# Patient Record
Sex: Female | Born: 1980 | Race: Black or African American | Hispanic: No | Marital: Single | Smoking: Current every day smoker
Health system: Southern US, Community
[De-identification: ages and names within clinical notes are randomized; demographics above are authoritative.]

## PROBLEM LIST (undated history)

## (undated) ENCOUNTER — Emergency Department (HOSPITAL_COMMUNITY): Discharge status: Against Medical Advice | Payer: Self-pay

## (undated) DIAGNOSIS — N390 Urinary tract infection, site not specified: Secondary | ICD-10-CM

## (undated) DIAGNOSIS — I10 Essential (primary) hypertension: Secondary | ICD-10-CM

## (undated) HISTORY — PX: TUBAL LIGATION: SHX77

## (undated) HISTORY — PX: CHOLECYSTECTOMY: SHX55

---

## 2006-05-06 ENCOUNTER — Inpatient Hospital Stay (HOSPITAL_COMMUNITY): Admission: AD | Admit: 2006-05-06 | Discharge: 2006-05-06 | Payer: Self-pay | Admitting: Obstetrics and Gynecology

## 2006-10-04 ENCOUNTER — Inpatient Hospital Stay (HOSPITAL_COMMUNITY): Admission: AD | Admit: 2006-10-04 | Discharge: 2006-10-04 | Payer: Self-pay | Admitting: Gynecology

## 2006-11-13 ENCOUNTER — Inpatient Hospital Stay (HOSPITAL_COMMUNITY): Admission: AD | Admit: 2006-11-13 | Discharge: 2006-11-13 | Payer: Self-pay | Admitting: Obstetrics & Gynecology

## 2006-12-11 ENCOUNTER — Inpatient Hospital Stay (HOSPITAL_COMMUNITY): Admission: AD | Admit: 2006-12-11 | Discharge: 2006-12-11 | Payer: Self-pay | Admitting: Obstetrics

## 2006-12-22 ENCOUNTER — Ambulatory Visit (HOSPITAL_COMMUNITY): Admission: RE | Admit: 2006-12-22 | Discharge: 2006-12-22 | Payer: Self-pay | Admitting: Obstetrics & Gynecology

## 2007-01-05 ENCOUNTER — Inpatient Hospital Stay (HOSPITAL_COMMUNITY): Admission: AD | Admit: 2007-01-05 | Discharge: 2007-01-05 | Payer: Self-pay | Admitting: Obstetrics & Gynecology

## 2007-01-08 ENCOUNTER — Inpatient Hospital Stay (HOSPITAL_COMMUNITY): Admission: AD | Admit: 2007-01-08 | Discharge: 2007-01-08 | Payer: Self-pay | Admitting: Obstetrics & Gynecology

## 2007-02-05 ENCOUNTER — Ambulatory Visit (HOSPITAL_COMMUNITY): Admission: RE | Admit: 2007-02-05 | Discharge: 2007-02-05 | Payer: Self-pay | Admitting: Obstetrics & Gynecology

## 2007-02-19 ENCOUNTER — Inpatient Hospital Stay (HOSPITAL_COMMUNITY): Admission: AD | Admit: 2007-02-19 | Discharge: 2007-02-19 | Payer: Self-pay | Admitting: Obstetrics & Gynecology

## 2007-03-17 ENCOUNTER — Inpatient Hospital Stay (HOSPITAL_COMMUNITY): Admission: AD | Admit: 2007-03-17 | Discharge: 2007-03-18 | Payer: Self-pay | Admitting: Obstetrics

## 2007-03-24 ENCOUNTER — Encounter: Admission: RE | Admit: 2007-03-24 | Discharge: 2007-04-20 | Payer: Self-pay | Admitting: Obstetrics & Gynecology

## 2007-04-09 ENCOUNTER — Inpatient Hospital Stay (HOSPITAL_COMMUNITY): Admission: AD | Admit: 2007-04-09 | Discharge: 2007-04-09 | Payer: Self-pay | Admitting: Obstetrics

## 2007-04-09 ENCOUNTER — Ambulatory Visit: Payer: Self-pay | Admitting: Vascular Surgery

## 2007-04-09 ENCOUNTER — Encounter: Payer: Self-pay | Admitting: Obstetrics

## 2007-04-14 ENCOUNTER — Inpatient Hospital Stay (HOSPITAL_COMMUNITY): Admission: AD | Admit: 2007-04-14 | Discharge: 2007-04-14 | Payer: Self-pay | Admitting: Obstetrics & Gynecology

## 2007-04-16 ENCOUNTER — Inpatient Hospital Stay (HOSPITAL_COMMUNITY): Admission: AD | Admit: 2007-04-16 | Discharge: 2007-04-16 | Payer: Self-pay | Admitting: Obstetrics

## 2007-04-18 ENCOUNTER — Inpatient Hospital Stay (HOSPITAL_COMMUNITY): Admission: AD | Admit: 2007-04-18 | Discharge: 2007-04-19 | Payer: Self-pay | Admitting: Obstetrics & Gynecology

## 2007-05-11 ENCOUNTER — Inpatient Hospital Stay (HOSPITAL_COMMUNITY): Admission: AD | Admit: 2007-05-11 | Discharge: 2007-05-14 | Payer: Self-pay | Admitting: Obstetrics & Gynecology

## 2007-12-02 ENCOUNTER — Emergency Department (HOSPITAL_COMMUNITY): Admission: EM | Admit: 2007-12-02 | Discharge: 2007-12-02 | Payer: Self-pay | Admitting: Emergency Medicine

## 2007-12-27 ENCOUNTER — Emergency Department (HOSPITAL_COMMUNITY): Admission: EM | Admit: 2007-12-27 | Discharge: 2007-12-27 | Payer: Self-pay | Admitting: Emergency Medicine

## 2008-01-20 ENCOUNTER — Emergency Department (HOSPITAL_COMMUNITY): Admission: EM | Admit: 2008-01-20 | Discharge: 2008-01-20 | Payer: Self-pay | Admitting: Emergency Medicine

## 2008-03-04 ENCOUNTER — Emergency Department (HOSPITAL_COMMUNITY): Admission: EM | Admit: 2008-03-04 | Discharge: 2008-03-04 | Payer: Self-pay | Admitting: Emergency Medicine

## 2008-03-06 ENCOUNTER — Inpatient Hospital Stay (HOSPITAL_COMMUNITY): Admission: EM | Admit: 2008-03-06 | Discharge: 2008-03-07 | Payer: Self-pay | Admitting: Emergency Medicine

## 2008-03-06 ENCOUNTER — Ambulatory Visit: Payer: Self-pay | Admitting: Family Medicine

## 2008-05-31 ENCOUNTER — Emergency Department (HOSPITAL_COMMUNITY): Admission: EM | Admit: 2008-05-31 | Discharge: 2008-06-01 | Payer: Self-pay | Admitting: Emergency Medicine

## 2008-09-13 ENCOUNTER — Emergency Department (HOSPITAL_COMMUNITY): Admission: EM | Admit: 2008-09-13 | Discharge: 2008-09-13 | Payer: Self-pay | Admitting: Emergency Medicine

## 2009-03-25 ENCOUNTER — Inpatient Hospital Stay (HOSPITAL_COMMUNITY): Admission: AD | Admit: 2009-03-25 | Discharge: 2009-03-26 | Payer: Self-pay | Admitting: Obstetrics & Gynecology

## 2009-04-28 ENCOUNTER — Ambulatory Visit (HOSPITAL_COMMUNITY): Admission: RE | Admit: 2009-04-28 | Discharge: 2009-04-28 | Payer: Self-pay | Admitting: Obstetrics

## 2009-07-20 ENCOUNTER — Encounter: Admission: RE | Admit: 2009-07-20 | Discharge: 2009-08-24 | Payer: Self-pay | Admitting: Obstetrics & Gynecology

## 2009-07-27 ENCOUNTER — Inpatient Hospital Stay (HOSPITAL_COMMUNITY): Admission: AD | Admit: 2009-07-27 | Discharge: 2009-07-29 | Payer: Self-pay | Admitting: Obstetrics

## 2009-08-20 ENCOUNTER — Emergency Department (HOSPITAL_COMMUNITY): Admission: EM | Admit: 2009-08-20 | Discharge: 2009-08-20 | Payer: Self-pay | Admitting: Emergency Medicine

## 2009-08-29 ENCOUNTER — Inpatient Hospital Stay (HOSPITAL_COMMUNITY): Admission: AD | Admit: 2009-08-29 | Discharge: 2009-08-29 | Payer: Self-pay | Admitting: Obstetrics & Gynecology

## 2009-09-08 ENCOUNTER — Observation Stay (HOSPITAL_COMMUNITY): Admission: AD | Admit: 2009-09-08 | Discharge: 2009-09-08 | Payer: Self-pay | Admitting: Obstetrics & Gynecology

## 2009-09-17 ENCOUNTER — Inpatient Hospital Stay (HOSPITAL_COMMUNITY): Admission: AD | Admit: 2009-09-17 | Discharge: 2009-09-18 | Payer: Self-pay | Admitting: Obstetrics

## 2009-09-25 ENCOUNTER — Inpatient Hospital Stay (HOSPITAL_COMMUNITY): Admission: AD | Admit: 2009-09-25 | Discharge: 2009-09-28 | Payer: Self-pay | Admitting: Obstetrics & Gynecology

## 2009-12-26 ENCOUNTER — Emergency Department (HOSPITAL_COMMUNITY): Admission: EM | Admit: 2009-12-26 | Discharge: 2009-12-26 | Payer: Self-pay | Admitting: Family Medicine

## 2010-02-16 ENCOUNTER — Ambulatory Visit (HOSPITAL_COMMUNITY): Admission: RE | Admit: 2010-02-16 | Discharge: 2010-02-16 | Payer: Self-pay | Admitting: Obstetrics & Gynecology

## 2010-02-23 ENCOUNTER — Encounter: Payer: Self-pay | Admitting: Gastroenterology

## 2010-03-13 ENCOUNTER — Encounter: Payer: Self-pay | Admitting: Gastroenterology

## 2010-04-09 ENCOUNTER — Encounter: Payer: Self-pay | Admitting: Gastroenterology

## 2010-04-18 ENCOUNTER — Emergency Department (HOSPITAL_COMMUNITY): Admission: EM | Admit: 2010-04-18 | Discharge: 2010-04-18 | Payer: Self-pay | Admitting: Family Medicine

## 2010-04-22 ENCOUNTER — Emergency Department (HOSPITAL_COMMUNITY): Admission: EM | Admit: 2010-04-22 | Discharge: 2010-04-22 | Payer: Self-pay | Admitting: Family Medicine

## 2010-05-14 ENCOUNTER — Emergency Department (HOSPITAL_COMMUNITY): Admission: EM | Admit: 2010-05-14 | Discharge: 2010-05-14 | Payer: Self-pay | Admitting: Emergency Medicine

## 2010-05-22 ENCOUNTER — Ambulatory Visit (HOSPITAL_COMMUNITY): Admission: RE | Admit: 2010-05-22 | Discharge: 2010-05-22 | Payer: Self-pay | Admitting: Obstetrics & Gynecology

## 2010-06-04 ENCOUNTER — Emergency Department (HOSPITAL_COMMUNITY): Admission: EM | Admit: 2010-06-04 | Discharge: 2010-06-04 | Payer: Self-pay | Admitting: Emergency Medicine

## 2010-06-11 ENCOUNTER — Encounter (INDEPENDENT_AMBULATORY_CARE_PROVIDER_SITE_OTHER): Payer: Self-pay | Admitting: *Deleted

## 2010-06-11 ENCOUNTER — Emergency Department (HOSPITAL_COMMUNITY): Admission: EM | Admit: 2010-06-11 | Discharge: 2010-06-11 | Payer: Self-pay | Admitting: Emergency Medicine

## 2010-06-19 ENCOUNTER — Emergency Department (HOSPITAL_COMMUNITY): Admission: EM | Admit: 2010-06-19 | Discharge: 2010-06-19 | Payer: Self-pay | Admitting: Emergency Medicine

## 2010-06-29 ENCOUNTER — Ambulatory Visit (HOSPITAL_COMMUNITY): Admission: RE | Admit: 2010-06-29 | Discharge: 2010-06-30 | Payer: Self-pay | Admitting: Surgery

## 2010-06-29 ENCOUNTER — Encounter (INDEPENDENT_AMBULATORY_CARE_PROVIDER_SITE_OTHER): Payer: Self-pay | Admitting: Surgery

## 2010-06-30 ENCOUNTER — Encounter (INDEPENDENT_AMBULATORY_CARE_PROVIDER_SITE_OTHER): Payer: Self-pay | Admitting: *Deleted

## 2010-07-11 ENCOUNTER — Emergency Department (HOSPITAL_COMMUNITY): Admission: EM | Admit: 2010-07-11 | Discharge: 2010-07-11 | Payer: Self-pay | Admitting: Emergency Medicine

## 2010-07-11 ENCOUNTER — Encounter (INDEPENDENT_AMBULATORY_CARE_PROVIDER_SITE_OTHER): Payer: Self-pay | Admitting: *Deleted

## 2010-07-18 ENCOUNTER — Encounter: Payer: Self-pay | Admitting: Gastroenterology

## 2010-10-29 ENCOUNTER — Emergency Department (HOSPITAL_COMMUNITY)
Admission: EM | Admit: 2010-10-29 | Discharge: 2010-10-30 | Payer: Self-pay | Source: Home / Self Care | Admitting: Emergency Medicine

## 2010-10-30 ENCOUNTER — Encounter (INDEPENDENT_AMBULATORY_CARE_PROVIDER_SITE_OTHER): Payer: Self-pay | Admitting: *Deleted

## 2010-11-05 LAB — DIFFERENTIAL
Basophils Absolute: 0 10*3/uL (ref 0.0–0.1)
Basophils Relative: 0 % (ref 0–1)
Eosinophils Absolute: 0.1 10*3/uL (ref 0.0–0.7)
Eosinophils Relative: 1 % (ref 0–5)
Lymphocytes Relative: 39 % (ref 12–46)
Lymphs Abs: 2.7 10*3/uL (ref 0.7–4.0)
Monocytes Absolute: 0.6 10*3/uL (ref 0.1–1.0)
Monocytes Relative: 9 % (ref 3–12)
Neutro Abs: 3.5 10*3/uL (ref 1.7–7.7)
Neutrophils Relative %: 51 % (ref 43–77)

## 2010-11-05 LAB — URINALYSIS, ROUTINE W REFLEX MICROSCOPIC
Bilirubin Urine: NEGATIVE
Hgb urine dipstick: NEGATIVE
Ketones, ur: NEGATIVE mg/dL
Nitrite: NEGATIVE
Protein, ur: NEGATIVE mg/dL
Specific Gravity, Urine: 1.029 (ref 1.005–1.030)
Urine Glucose, Fasting: NEGATIVE mg/dL
Urobilinogen, UA: 1 mg/dL (ref 0.0–1.0)
pH: 8 (ref 5.0–8.0)

## 2010-11-05 LAB — BASIC METABOLIC PANEL
BUN: 17 mg/dL (ref 6–23)
CO2: 27 mEq/L (ref 19–32)
Calcium: 9.3 mg/dL (ref 8.4–10.5)
Chloride: 103 mEq/L (ref 96–112)
Creatinine, Ser: 0.61 mg/dL (ref 0.4–1.2)
GFR calc Af Amer: >60 mL/min (ref >60)
GFR calc non Af Amer: >60 mL/min (ref >60)
Glucose, Bld: 102 mg/dL — ABNORMAL HIGH (ref 70–99)
Potassium: 3.9 mEq/L (ref 3.5–5.1)
Sodium: 136 mEq/L (ref 135–145)

## 2010-11-05 LAB — CBC
HCT: 35.4 % — ABNORMAL LOW (ref 36.0–46.0)
Hemoglobin: 11.6 g/dL — ABNORMAL LOW (ref 12.0–15.0)
MCH: 28.2 pg (ref 26.0–34.0)
MCHC: 32.8 g/dL (ref 30.0–36.0)
MCV: 85.9 fL (ref 78.0–100.0)
Platelets: 291 10*3/uL (ref 150–400)
RBC: 4.12 MIL/uL (ref 3.87–5.11)
RDW: 13.5 % (ref 11.5–15.5)
WBC: 6.9 10*3/uL (ref 4.0–10.5)

## 2010-11-05 LAB — POCT PREGNANCY, URINE: Preg Test, Ur: NEGATIVE

## 2010-11-06 ENCOUNTER — Encounter: Payer: Self-pay | Admitting: Gastroenterology

## 2010-11-11 ENCOUNTER — Encounter: Payer: Self-pay | Admitting: Obstetrics & Gynecology

## 2010-11-20 ENCOUNTER — Ambulatory Visit
Admission: RE | Admit: 2010-11-20 | Discharge: 2010-11-20 | Payer: Self-pay | Source: Home / Self Care | Attending: Gastroenterology | Admitting: Gastroenterology

## 2010-11-20 ENCOUNTER — Other Ambulatory Visit: Payer: Self-pay | Admitting: Gastroenterology

## 2010-11-20 ENCOUNTER — Encounter (INDEPENDENT_AMBULATORY_CARE_PROVIDER_SITE_OTHER): Payer: Self-pay | Admitting: *Deleted

## 2010-11-20 DIAGNOSIS — K8020 Calculus of gallbladder without cholecystitis without obstruction: Secondary | ICD-10-CM | POA: Insufficient documentation

## 2010-11-20 DIAGNOSIS — D649 Anemia, unspecified: Secondary | ICD-10-CM | POA: Insufficient documentation

## 2010-11-20 DIAGNOSIS — R1032 Left lower quadrant pain: Secondary | ICD-10-CM | POA: Insufficient documentation

## 2010-11-20 DIAGNOSIS — G4730 Sleep apnea, unspecified: Secondary | ICD-10-CM | POA: Insufficient documentation

## 2010-11-20 DIAGNOSIS — K219 Gastro-esophageal reflux disease without esophagitis: Secondary | ICD-10-CM | POA: Insufficient documentation

## 2010-11-20 LAB — CBC WITH DIFFERENTIAL/PLATELET
Basophils Absolute: 0.1 10*3/uL (ref 0.0–0.1)
Eosinophils Relative: 0.5 % (ref 0.0–5.0)
HCT: 34.9 % — ABNORMAL LOW (ref 36.0–46.0)
Hemoglobin: 11.7 g/dL — ABNORMAL LOW (ref 12.0–15.0)
Lymphocytes Relative: 37.1 % (ref 12.0–46.0)
Lymphs Abs: 2.1 10*3/uL (ref 0.7–4.0)
Monocytes Relative: 5.9 % (ref 3.0–12.0)
Neutro Abs: 3.1 10*3/uL (ref 1.4–7.7)
Platelets: 301 10*3/uL (ref 150.0–400.0)
RDW: 14.7 % — ABNORMAL HIGH (ref 11.5–14.6)
WBC: 5.6 10*3/uL (ref 4.5–10.5)

## 2010-11-20 LAB — URINALYSIS, ROUTINE W REFLEX MICROSCOPIC
Bilirubin Urine: NEGATIVE
Ketones, ur: NEGATIVE
Leukocytes, UA: NEGATIVE
Specific Gravity, Urine: 1.01 (ref 1.000–1.030)
Total Protein, Urine: NEGATIVE
Urine Glucose: NEGATIVE
pH: 6.5 (ref 5.0–8.0)

## 2010-11-20 LAB — HEPATIC FUNCTION PANEL
ALT: 18 U/L (ref 0–35)
AST: 20 U/L (ref 0–37)
Albumin: 4 g/dL (ref 3.5–5.2)
Alkaline Phosphatase: 68 U/L (ref 39–117)
Bilirubin, Direct: 0.1 mg/dL (ref 0.0–0.3)
Total Protein: 7.1 g/dL (ref 6.0–8.3)

## 2010-11-20 LAB — BASIC METABOLIC PANEL
CO2: 30 mEq/L (ref 19–32)
Chloride: 105 mEq/L (ref 96–112)
Glucose, Bld: 82 mg/dL (ref 70–99)
Sodium: 139 mEq/L (ref 135–145)

## 2010-11-22 NOTE — Letter (Addendum)
Summary: New Patient letter  [facility]  [address] [place], [place] [address]   Phone: [phone]  Fax: [phone]       11/06/2010 MRN: [id number]  Molly [name] [address] [place], [place]  [address]  Dear Molly [name],  Welcome to the Gastroenterology Division at [facility].    You are scheduled to see Dr.  Jarold Motto on 11-20-2010 at 2pm on the 3rd floor at [facility], [address]  We ask that you try to arrive at our office 15 minutes prior to your appointment time to allow for check-in.  We would like you to complete the enclosed self-administered evaluation form prior to your visit and bring it with you on the day of your appointment.  We will review it with you.  Also, please bring a complete list of all your medications or, if you prefer, bring the medication bottles and we will list them.  Please bring your insurance card so that we may make a copy of it.  If your insurance requires a referral to see a specialist, please bring your referral form from your primary care physician.  Co-payments are due at the time of your visit and may be paid by cash, check or credit card.     Your office visit will consist of a consult with your physician (includes a physical exam), any laboratory testing he/she may order, scheduling of any necessary diagnostic testing (e.g. x-ray, ultrasound, CT-scan), and scheduling of a procedure (e.g. Endoscopy, Colonoscopy) if required.  Please allow enough time on your schedule to allow for any/all of these possibilities.    If you cannot keep your appointment, please call [phone] to cancel or reschedule prior to your appointment date.  This allows [place] the opportunity to schedule an appointment for another patient in need of care.  If you do not cancel or reschedule by 5 p.m. the business day prior to your appointment date, you will be charged a $50.00 late cancellation/no-show fee.    Thank you for choosing [facility] for your medical needs.  We appreciate the opportunity to care for you.  Please visit [place] at our website  to learn more about our practice.                     Sincerely,                                                             The Gastroenterology Division

## 2010-11-28 ENCOUNTER — Other Ambulatory Visit (AMBULATORY_SURGERY_CENTER): Payer: Medicaid Other | Admitting: Gastroenterology

## 2010-11-28 ENCOUNTER — Encounter: Payer: Self-pay | Admitting: Gastroenterology

## 2010-11-28 DIAGNOSIS — K5730 Diverticulosis of large intestine without perforation or abscess without bleeding: Secondary | ICD-10-CM

## 2010-11-28 DIAGNOSIS — R109 Unspecified abdominal pain: Secondary | ICD-10-CM

## 2010-11-28 DIAGNOSIS — D126 Benign neoplasm of colon, unspecified: Secondary | ICD-10-CM

## 2010-11-28 NOTE — Letter (Signed)
Summary: [facility] Instructions  [facility]  [address] [place], [place] [address]   Phone: [phone]  Fax: [phone]       Molly [name]    1980/12/25    MRN: [id number]        Procedure Day Dorna Bloom: Wednesday 11/28/2010     Arrival Time: 8am     Procedure Time: 9am     Location of Procedure:                    X  [facility] (4th Floor)  PREPARATION FOR COLONOSCOPY WITH MOVIPREP   Starting 5 days prior to your procedure 11/23/2010 do not eat nuts, seeds, popcorn, corn, beans, peas,  salads, or any raw vegetables.  Do not take any fiber supplements (e.g. Metamucil, Citrucel, and Benefiber).  THE DAY BEFORE YOUR PROCEDURE         Tuesday 11/27/2010  1.  Drink clear liquids the entire day-NO SOLID FOOD  2.  Do not drink anything colored red or purple.  Avoid juices with pulp.  No orange juice.  3.  Drink at least 64 oz. (8 glasses) of fluid/clear liquids during the day to prevent dehydration and help the prep work efficiently.  CLEAR LIQUIDS INCLUDE: Water Jello Ice Popsicles Tea (sugar ok, no milk/cream) Powdered fruit flavored drinks Coffee (sugar ok, no milk/cream) Gatorade Juice: apple, white grape, white cranberry  Lemonade Clear bullion, consomm, broth Carbonated beverages (any kind) Strained chicken noodle soup Hard Candy                             4.  In the morning, mix first dose of MoviPrep solution:    Empty 1 Pouch A and 1 Pouch B into the disposable container    Add lukewarm drinking water to the top line of the container. Mix to dissolve    Refrigerate (mixed solution should be used within 24 hrs)  5.  Begin drinking the prep at 5:00 p.m. The MoviPrep container is divided by 4 marks.   Every 15 minutes drink the solution down to the next mark (approximately 8 oz) until the full liter is complete.   6.  Follow completed prep with 16 oz of clear liquid of your choice (Nothing red or purple).  Continue to drink clear  liquids until bedtime.  7.  Before going to bed, mix second dose of MoviPrep solution:    Empty 1 Pouch A and 1 Pouch B into the disposable container    Add lukewarm drinking water to the top line of the container. Mix to dissolve    Refrigerate  THE DAY OF YOUR PROCEDURE      Wednesday 11/28/2010  Beginning at 4:00am (5 hours before procedure):         1. Every 15 minutes, drink the solution down to the next mark (approx 8 oz) until the full liter is complete.  2. Follow completed prep with 16 oz. of clear liquid of your choice.    3. You may drink clear liquids until 7:00am (2 HOURS BEFORE PROCEDURE).   MEDICATION INSTRUCTIONS  Unless otherwise instructed, you should take regular prescription medications with a small sip of water   as early as possible the morning of your procedure.        OTHER INSTRUCTIONS  You will need a responsible adult at least 32 years of age to accompany you and drive you home.  This person must remain in the waiting room during your procedure.  Wear loose fitting clothing that is easily removed.  Leave jewelry and other valuables at home.  However, you may wish to bring a book to read or  an iPod/MP3 player to listen to music as you wait for your procedure to start.  Remove all body piercing jewelry and leave at home.  Total time from sign-in until discharge is approximately 2-3 hours.  You should go home directly after your procedure and rest.  You can resume normal activities the  day after your procedure.  The day of your procedure you should not:   Drive   Make legal decisions   Operate machinery   Drink alcohol   Return to work  You will receive specific instructions about eating, activities and medications before you leave.    The above instructions have been reviewed and explained to me by   _______________________    I fully understand and can verbalize these instructions _____________________________ Date  _________

## 2010-11-28 NOTE — Consult Note (Signed)
Summary: Dr. Ezzard Standing    NAME:  Molly [name], Molly [name]               ACCOUNT NO.:  [id number]      MEDICAL RECORD NO.:  [id number]          PATIENT TYPE:  EMS      LOCATION:  ED                           FACILITY[facility]  [facility]      PHYSICIAN:  Sandria Bales. Ezzard Standing, M.D.  DATE OF BIRTH:  20-Feb-1981      DATE OF CONSULTATION:  06/11/2010                                    CONSULTATION      This patient has 2 medical records unit numbers in the system.      REASON OF CONSULTATION:  Gallstones.      REFERRING PHYSICIAN:  Eber Hong, MD.      HISTORY OF ILLNESS:  Molly [name] is a 30 year old black female who sees   Dr. Tamela Oddi, her primary medical and GYN doctor.  Molly [name] claims   to have had abdominal pains on and off for a year.  She says it often   happens at night.  She really cannot relate it to food.  She has had no   nausea or vomiting, no change in bowel habits, and no known   gastrointestinal symptoms.      She thinks she may have brought this to the attention of Dr. Jean Rosenthal-   Christell Constant, may be once in the past, but he really not pursued it very   thoroughly.      Last night at about 6 o'clock, she had some lasagna, then last night   started hurting and came in this morning with abdominal pain.  She kind   of points to right above her umbilicus or midline, it did not lateralize   to right or left side.  No nausea or vomiting and this prompted an   evaluation by Dr. Hyacinth Meeker.      An ultrasound done showed multiple gallstones, each about 5-6 mm and a   distended gallbladder, but no other acute things seen on ultrasound.      The patient herself never had an endoscopy, never had any liver disease.   No family history of gallbladder disease, pancreatic disease, colon   disease.  She has had no prior abdominal surgery.      PAST MEDICAL HISTORY:  She has no allergies.      Her current medications include diphenhydramine and prednisone 10 mg   daily.  She is taking this for some breaking  out.  She says she came to   the Emergency Room at [facility].  I cannot find any records in the   chart, but again, she was treated for bug bite or some allergy, which is   pretty much resolved.  She has a lot on her face and maybe upper arms.      REVIEW OF SYSTEMS:     NEUROLOGIC:  No seizure or loss of conscious.   PULMONARY:  She smokes a cigarette or 2 a day, but does not smoke at   home so that her kids do not know about it.  She admits to cigarette   smoking  and has no history of lung disease.     CARDIAC:  She has no   history of heart disease or chest pain.  No cardiac evaluation.   GASTROINTESTINAL:  See history of present illness.  UROLOGIC:  No   history of kidney stones or kidney infections.     GYN:  She has 8 children ranging from her last child was born, September 26, 2009, who is an 32-   month-old, up to about a 101 year old child.    MUSCULOSKELETAL:  She had no joint or back pain.      She is not married, but the father of her last child lives with her.   She works as a Conservation officer, nature at [employer],  has 8 children.      PHYSICAL EXAMINATION:  VITAL SIGNS:  Her temperature is 98.6, blood   pressure 135/93, pulse is 80, she is 99% saturated.   HEENT:  Unremarkable.  I do not see much of a rash right now.  She says   this cleared up a lot since she was seen 6-7 days ago.   NECK:  Supple.  I feel no mass or thyromegaly.   LUNGS:  Clear to auscultation with symmetric breath sounds.   HEART:  Regular rate and rhythm.  I hear no murmur or rub.   BREASTS:  I did not examine her breasts.   ABDOMEN:  A little bit pouchy.  She has no guarding, no rebound, no   tenderness, no abdominal scars, no organomegaly.   RECTAL:  I did not do a rectal exam.   EXTREMITIES:  She has good strength in upper and lower extremities.  Of   note, she did have a DVT with her twins who are about 32 years old now   and she was on Coumadin, her blood thinner, she thinks for maybe several   weeks.  She has had  no further problems with DVTs.      Her labs that I have show a hemoglobin of 12, hematocrit 36.8, white   blood count of 9500 with 53% neutrophils.  Her lipase is 25.  Her sodium   is 140, potassium 3.3, chloride of 105, CO2 of 27, glucose of 95,   albumin 3.6.  Liver functions were normal.  Urinalysis was normal.      IMPRESSION:   1. Symptomatic cholelithiasis in a patient who is asymptomatic right       now.  She wants to go home and have this set up and taken care of       electively in 2-6 weeks.  I gave her prescription for Vicodin in       case she has some symptoms between now and her surgery.  I gave her       a note to call our office later today to schedule the surgery for       laparoscopic cholecystectomy with cholangiogram.  I described to       her gallbladder surgery, the risks of gallbladder surgery include       bleeding, infection, open surgery, and common bile duct injury.       Hospitalization is either an outpatient or overnight stay.   2. Diagnosis #2 is resolving allergy versus bug bite to the face, been       on prednisone, which she is either tapering or stopping soon.   3. 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  Sandria Bales. Ezzard Standing, M.D., FACS         DHN/MEDQ  D:  06/11/2010  T:  06/12/2010  Job:  [id number]      cc:   Roseanna Rainbow, M.D.   Fax: [phone]      Electronically Signed by Ovidio Kin M.D. on 06/19/2010 12:49:44 PM

## 2010-11-28 NOTE — Op Note (Signed)
Summary: Laparoscopic cholecystectomy & Pathology    NAME:  Molly [name], Molly [name]               ACCOUNT NO.:  [id number]      MEDICAL RECORD NO.:  [id number]          PATIENT TYPE:  OIB      LOCATION:  1526                         FACILITY:  [facility]      PHYSICIAN:  Sandria Bales. Ezzard Standing, M.D.  DATE OF BIRTH:  09-04-1981      DATE OF PROCEDURE:  06/29/2010                                  OPERATIVE REPORT      PREOPERATIVE DIAGNOSES:  Chronic cholecystitis and cholelithiasis.      POSTOPERATIVE DIAGNOSES:  Chronic cholecystitis and cholelithiasis.      PROCEDURE:  Laparoscopic cholecystectomy with intraoperative   cholangiogram.      SURGEON:  Sandria Bales. Ezzard Standing, M.D.      FIRST ASSISTANT:  Amber L. Freida Busman, MD      ANESTHESIA:  General endotracheal with approximately 30 cc of 0.25%   Marcaine.      COMPLICATIONS:  None.      INDICATION OF PROCEDURE:  Ms Lewellen who sees Dr. Tamela Oddi from a   primary medical GYN standpoint presented to the [facility] Emergency   Room on the 22nd of August 2011, where I saw her in consultation for   symptomatic gallbladder disease.  She was not acutely ill at that time.   I discussed with her about gallbladder surgery, I discharged her, and   she now returns for cholecystectomy.  She said she back to the ER once with recurrent abdominal pain.  Our service was not  involved in that visit.      I discussed with her the indications and potential complications of gall bladder surgery   including, but not limited to, bleeding (she's a Jahovah's witness), infection, open surgery, and the possibility of common  bile duct injury.      OPERATIVE NOTE:  The patient was placed in the supine position given a   general endotracheal anesthetic.  She had her abdomen prepped with   ChloraPrep and sterilely draped.  She was given 1 g of Ancef at the   initiation of the procedure .  The surgical checklist was run and a time-   out held.  Of note, she is a Jehovah's witness  and refused any blood   transfusion if necessary.      The abdomen was then accessed through an infraumbilical incision with   sharp dissection carried down to the abdominal cavity.  A 0-degree 10-mm   laparoscope was inserted through a 12-mm Hasson trocar.  The Hasson   trocar was secured with a 0 Vicryl suture.  I placed 4 additional   trocars and 10-mm subxiphoid trocar, a 5 mm just to the left of midline,   a 5-mm right subcostal, and a 5-mm right lateral subcostal.  Additional   trocar was placed because the medial aspect of the left lobe of the   liver flopped over the gallbladder and the cystic duct.  I could not see   it without retracting this during the operation.      The gallbladder was  grasped, rotated cephalad.  There was a single   adhesive band over the dome of the liver that I cut, otherwise, the   liver itself was unremarkable.  The stomach was unremarkable.  The bowel   that I could see was unremarkable.      I then started my dissection along the gallbladder cystic duct junction.   The gallbladder itself was white consisten with chronic cholecystitis.  She had a fair amount of scar tissue between thecystic  duct and gallbladder junction consistent with chronic   right cholecystitis.  I dissected out the cystic duct.  I dissected out   the cystic artery and put 2 Endoclips in the cystic artery with a clip   on the gallbladder side of the cystic duct and shot intraoperative   cholangiogram.      The intraoperative cholangiogram was shot using cutoff Taut catheter   inserted through a 14-gauge Jelco, I inserted it into the side of the   cut cystic duct.      The Taut catheter was secured with an Endoclip and then under   fluoroscopy, I injected about 8 to 10 cc of half-strength Hypaque   solution.  This showed free flow of contrast down the cystic duct into   the common bile duct into the duodenum and up to the hepatic radicals.   There was no filling defect and no  mass.  It looks almost like the tube   was in the common bile duct, but I think it is just the way the cystic   duct is rolled on top of the common bile duct on fluoroscopy, but this   was a normal intraoperative cholangiogram.      After the cholangiogram was read normal, I then placed 3 clips across   the cystic duct and divided the cystic duct from the gallbladder.  I   then sharply blunt dissected the gallbladder from the gallbladder bed,   placed the gallbladder in endo catch bag delivered through the   umbilicus.  I then reinspected the abdomen.  There was no bleeding or   bile leak of the liver bed or the triangle of Calot.  I then removed the   trocars and turned.  The umbilical port was closed with 0 Vicryl suture.   The skin each port was closed with a 5-0 Monocryl, painted with tincture   benzoin and Steri-Strips.        The patient tolerated the procedure well and   was transferred to the recovery room in good condition.  I spoke to her   before and she really want to spend the night, so our plan right now is   plan to keep her overnight for observation and discharge tomorrow.      Sandria Bales. Ezzard Standing, M.D., FACS         DHN/MEDQ  D:  06/29/2010  T:  06/30/2010  Job:  [id number]      cc:   Roseanna Rainbow, M.D.   Fax: [phone]      Electronically Signed by Ovidio Kin M.D. on 07/01/2010 11:91:47 PM     SP-Surgical Pathology - STATUS: Final  .                                         Perform Date: 23 Sep11 00:01  Ordered By: Ernst Spell,  Ordered Date:  Facility: [facility]                              Department: CPATH  Service Report Text  [facility]   [address]   [place] , [place] [address]   Telephone : [phone] Fax : [phone]    REPORT OF SURGICAL PATHOLOGY    Accession #: [id number]   Patient Name: Molly [name]   Visit # : [id number]    MRN: [id number]   Physician: Ovidio Kin   DOB/Age [birth year]  (Age: 30) Gender: F   Collected Date: 06/29/2010   Received Date: 06/29/2010    FINAL DIAGNOSIS    1. Gallbladder, :   CHRONIC CHOLECYSTITIS AND CHOLELITHIASIS.    DATE SIGNED OUT: 07/02/2010   ELECTRONIC SIGNATURE : Luisa Hart M.D., Jonny Ruiz, Pathologist, Electronic Signature    MICROSCOPIC DESCRIPTION   1. The attached lymph node shows benign reactive changes. (JDP:mw 07-02-10)    CASE COMMENTS    CLINICAL HISTORY    SPECIMEN(S) OBTAINED   1. Gallbladder,    SPECIMEN COMMENTS:   1. (las)    Gross Description   1. Size/?Intact: 9.4 x 2.6 x 2.4 cm intact gallbladder, received in formalin.   Serosa: Smooth and hyperemic.   Mucosa/Wall: The mucosa is glistening tan red and the wall measures up to 0.5   cm in thickness.   Contents: The gallbladder contains bile and multiple green calculi measuring   0.7 to 0.9 cm in greatest dimension.   Cystic duct: 0.3 cm in length and patent. Adjacent to the cystic duct there is   a 0.7 cm rubbery tan pink nodule.   Block Summary: One block submitted. GP/eps 06/29/10    Report signed out from the following location(s)   . [facility]   [address], [place] [address] CLIA #: [id number]    [facility]   [address] [place], [place] [address] CLIA #: [id number]  Additional Information  [id number] RESULT STATUS : F  External IF Update Timestamp : 2010-07-02:16:44:00.000000

## 2010-11-28 NOTE — Assessment & Plan Note (Signed)
Summary: Diverticulitis   History of Present Illness Visit Type: Initial Consult Primary GI MD: Sheryn Bison MD FACP FAGA Primary Provider: Antionette Char, MD Requesting Provider: Dr Antionette Char Chief Complaint: Patient here following diverticulitis flare. She states that she stilll experiences LLQ abdominal pain but denies any residual diarrhea, fever, blood in stool. History of Present Illness:   30 year old African American female referred by [facility] emergency room for evaluation of lower abdominal pain over the last month unresponsive to treatment with metronidazole and Cipro. She is status post laparoscopic cholecystectomy by Dr. Ovidio Kin last fall for a dysfunctional gallbladder. Multiple gallstones at that time were also noted on ultrasound exam.  [facility] on January 10 and had a CT scan which suggested possible right-sided diverticulitis versus appendicitis. Exam showed right lower quadrant tenderness without rebound. Urinalysis was normal, pregnancy test was negative, and CBC and metabolic profile was normal but I cannot see liver function tests.  Her pain currently is in the left lower quadrant, is constant, and has no real initiating or alleviating elements. She has regular bowel movements and denies melena or hematochezia. There is no history of fever, chills, specific genitourinary or gynecologic problems. She is followed at [facility] and had a negative pelvic exam in June of 2011. She has had previous tubal ligation. She does give a history of recurrent urinary tract infections and has none kidney stones. Family history is noncontributory. She follows a regular diet denies any specific food intolerances.   GI Review of Systems    Reports abdominal pain, acid reflux, and  bloating.     Location of  Abdominal pain: LLQ.    Denies belching, chest pain, dysphagia with liquids, dysphagia with solids, heartburn, loss of appetite, nausea, vomiting,  vomiting blood, weight loss, and  weight gain.      Reports diverticulosis.     Denies anal fissure, black tarry stools, change in bowel habit, constipation, diarrhea, fecal incontinence, heme positive stool, hemorrhoids, irritable bowel syndrome, jaundice, light color stool, liver problems, rectal bleeding, and  rectal pain.  Preventive Screening-Counseling & Management  Alcohol-Tobacco     Smoking Status: quit      Drug Use:  no.      Current Medications (verified): 1)  Omeprazole 20 Mg Cpdr (Omeprazole) .... Take 1 Tablet By Mouth Once A Day  Allergies (verified): No Known Drug Allergies  Past History:  Past medical, surgical, family and social histories (including risk factors) reviewed for relevance to current acute and chronic problems.  Past Medical History: Current Problems:  GERD (ICD-530.81) SLEEP APNEA (ICD-780.57) CHOLELITHIASIS (ICD-574.20) ANEMIA (ICD-285.9)    Past Surgical History: Tubal Ligation Cholecystectomy  Family History: Reviewed history and no changes required. No FH of Colon Cancer: Family History of Diabetes: Mother, Grandfather  Social History: Reviewed history and no changes required. Patient is a former smoker. -stopped 2 months ago Daily Caffeine Use-3-4 cups daily Illicit Drug Use - no Alcohol Use - yes-1 drink per week Smoking Status:  quit Drug Use:  no  Review of Systems       The patient complains of back pain, blood in urine, confusion, headaches-new, itching, menstrual pain, skin rash, and sleeping problems.  The patient denies allergy/sinus, anemia, anxiety-new, arthritis/joint pain, breast changes/lumps, change in vision, cough, coughing up blood, depression-new, fainting, fatigue, fever, hearing problems, heart murmur, heart rhythm changes, muscle pains/cramps, night sweats, nosebleeds, pregnancy symptoms, shortness of breath, sore throat, swelling of feet/legs, swollen lymph glands, thirst - excessive ,  urination -  excessive , urination changes/pain, urine leakage, vision changes, and voice change.    Vital Signs:  Patient profile:   30 year old female Height:      64 inches Weight:      154.13 pounds BMI:     26.55 BSA:     1.75 Pulse rate:   92 / minute Pulse rhythm:   regular BP sitting:   110 / 80  (left arm)  Vitals Entered By: Lamona Curl CMA Duncan Dull) (November 20, 2010 2:07 PM)  Physical Exam  General:  Well developed, well nourished, no acute distress.healthy appearing.   Head:  Normocephalic and atraumatic. Eyes:  PERRLA, no icterus.exam deferred to patient's ophthalmologist.   Neck:  thyroid is palpable but nonnodular nontender. Lungs:  Clear throughout to auscultation. Heart:  Regular rate and rhythm; no murmurs, rubs,  or bruits. Abdomen:  Soft, nontender and nondistended. No masses, hepatosplenomegaly or hernias noted. Normal bowel sounds.Tenderness over the sigmoid colon area without rebound tenderness. Rectal:  Normal exam.hemocult negative.   Msk:  Symmetrical with no gross deformities. Normal posture. Pulses:  Normal pulses noted. Extremities:  No clubbing, cyanosis, edema or deformities noted. Neurologic:  Alert and  oriented x4;  grossly normal neurologically. Skin:  pigmented areas over her lower extremities bilaterally without evidence of phlebitis or cellulitis. Cervical Nodes:  No significant cervical adenopathy. Psych:  Alert and cooperative. Normal mood and affect.   Impression & Recommendations:  Problem # 1:  ABDOMINAL PAIN, LEFT LOWER QUADRANT (ICD-789.04) Assessment Unchanged unusual presentation of lower abdominal pain a 30 year old patient without other GI complaints. I will repeat her labs including liver function test since she is status post cholecystectomy, and apparently had choledocholithiasis. It is possible that she has inflammatory bowel disease versus subacute diverticulitis versus an unusual gynecologic problems. We will proceed with  colonoscopy and give her p.r.n. tramadol 50 mg every 6-8 hours and p.r.n. Levsin with high fiber diet and daily fiber supplements.We Probably will need followup gynecologic evaluation. Orders: TLB-CBC Platelet - w/Differential (85025-CBCD) TLB-BMP (Basic Metabolic Panel-BMET) (80048-METABOL) TLB-Hepatic/Liver Function Pnl (80076-HEPATIC) TLB-TSH (Thyroid Stimulating Hormone) (84443-TSH) TLB-CRP-High Sensitivity (C-Reactive Protein) (86140-FCRP) TLB-Sedimentation Rate (ESR) (85652-ESR) TLB-Udip w/ Micro (81001-URINE) Colonoscopy (Colon)  Problem # 2:  GERD (ICD-530.81) Assessment: Improved continue omeprazole 20 mg a day as needed for her reflux symptoms.  Problem # 3:  CHOLELITHIASIS (ICD-574.20) Assessment: Improved status post laproscopic cholecystectomy by Dr. Ovidio Kin. She denies any current upper GI or hepatobiliary symptomatology. Liver function tests, amylase, and lipase have been ordered.  Patient Instructions: 1)  Copy sent to : Antionette Char, MD and Dr. Ovidio Kin at [facility] surgery 2)  Please go to the basement today for your labs.  3)  Your prescription(s) have been sent to you pharmacy.  4)  Your procedure has been scheduled for 11/28/2010, please follow the seperate instructions.  5)  [facility] Patient Information Guide given to patient.  6)  Colonoscopy and Flexible Sigmoidoscopy brochure given.  7)  High Fiber, Low Fat  Healthy Eating Plan brochure given.  8)  The medication list was reviewed and reconciled.  All changed / newly prescribed medications were explained.  A complete medication list was provided to the patient / caregiver. Prescriptions: TRAMADOL HCL 50 MG TABS (TRAMADOL HCL) take one by mouth two times a day  #60 x 3   Entered by:   Harlow Mares CMA (AAMA)   Authorized by:   Mardella Layman MD [facility]  Signed by:   Mardella Layman MD [facility] on 11/20/2010   Method used:   Electronically to        [facility] [phone]*  (retail)       [address], [place]  [address]       Ph: [phone] or [phone]       Fax: [phone]   RxID:   [id number] LEVSIN 0.125 MG TABS (HYOSCYAMINE SULFATE) take one by mouth two times a day  #60 x 3   Entered by:   Harlow Mares CMA (AAMA)   Authorized by:   Mardella Layman MD [facility]   Signed by:   Mardella Layman MD [facility] on 11/20/2010   Method used:   [facility] to        [facility] [phone]* (retail)       [address]       [place], [place]  [address]       Ph: [phone] or [phone]       Fax: [phone]   RxID:   [id number] MOVIPREP 100 GM  SOLR (PEG-KCL-NACL-NASULF-NA ASC-C) As per prep instructions.  #1 x 0   Entered by:   Harlow Mares CMA (AAMA)   Authorized by:   Mardella Layman MD [facility]   Signed by:   Mardella Layman MD [facility] on 11/20/2010   Method used:   [facility]        [facility] [phone]* (retail)       [address]       [place], [place]  [address]       Ph: [phone] or [phone]       Fax: [phone]   RxID:   [id number]

## 2010-12-06 NOTE — Letter (Signed)
Summary: [facility]  [facility]   Imported By: Sherian Rein 11/28/2010 11:33:17  _____________________________________________________________________  External Attachment:    Type:   Image     Comment:   External Document

## 2010-12-06 NOTE — Letter (Signed)
Summary: [facility]  [facility]   Imported By: Sherian Rein 11/28/2010 11:35:18  _____________________________________________________________________  External Attachment:    Type:   Image     Comment:   External Document

## 2010-12-06 NOTE — Letter (Signed)
Summary: Ovidio Kin MD  Ovidio Kin MD   Imported By: Sherian Rein 11/28/2010 11:38:15  _____________________________________________________________________  External Attachment:    Type:   Image     Comment:   External Document

## 2010-12-06 NOTE — Letter (Signed)
Summary: [facility]  [facility]   Imported By: Sherian Rein 11/28/2010 11:29:56  _____________________________________________________________________  External Attachment:    Type:   Image     Comment:   External Document

## 2010-12-12 NOTE — Procedures (Signed)
Summary: Colonoscopy   Colonoscopy  Procedure date:  11/28/2010  Findings:      Location:  [facility].   COLONOSCOPY PROCEDURE REPORT  PATIENT:  Molly, [name]  MR#:  [id number] BIRTHDATE:   July 06, 1981, 29 yrs. old   GENDER:   female ENDOSCOPIST:   Vania Rea. Jarold Motto, MD, [facility] REF. BY:  PROCEDURE DATE:  11/28/2010 PROCEDURE:  Colonoscopy with snare polypectomy ASA CLASS:   Class II INDICATIONS: Abdominal pain  MEDICATIONS:    Fentanyl 50 mcg IV, Versed 5 mg IV  DESCRIPTION OF PROCEDURE:   After the risks benefits and alternatives of the procedure were thoroughly explained, informed consent was obtained.  Digital rectal exam was performed and revealed no abnormalities.   The LB 180AL [id number] endoscope was introduced through the anus and advanced to the cecum, which was identified by both the appendix and ileocecal valve, without limitations.  The quality of the prep was excellent, using MoviPrep.  The instrument was then slowly withdrawn as the colon was fully examined. <<PROCEDUREIMAGES>>          <<OLD IMAGES>>  FINDINGS:  Severe diverticulosis was found found scattered throught the colon. MOST CONCENTRATED IN LEFT COLON,BUT PANCOLONIC DIVERTICULOSIS.THE APPENDIX HAD A SMALL POLYP BESIDE THE ORIFICE THAT WAS SNARE EXCISED,BUT NOT RETRIEVED.  This was otherwise a normal examination of the colon.   Retroflexed views in the rectum revealed no abnormalities.    The scope was then withdrawn from the patient and the procedure completed.  COMPLICATIONS:   None ENDOSCOPIC IMPRESSION:  1) Severe diverticulosis found scattered throught the colon  2) Otherwise normal examination  PROBABLE RESOLVED DIVERTICULITIS. RECOMMENDATIONS:  1) high fiber diet  2) metamucil or benefiber  3) Continue current colorectal screening recommendations for "routine risk" patients with a repeat colonoscopy in 10 years. REPEAT EXAM:   No   _______________________________ Vania Rea.  Jarold Motto, MD, [facility]  CC:     Appended Document: Colonoscopy     Procedures Next Due Date:    Colonoscopy: 11/2020

## 2011-01-03 LAB — URINE CULTURE: Culture  Setup Time: 201109212140

## 2011-01-03 LAB — BASIC METABOLIC PANEL
BUN: 4 mg/dL — ABNORMAL LOW (ref 6–23)
Calcium: 8.9 mg/dL (ref 8.4–10.5)
Chloride: 104 mEq/L (ref 96–112)
Creatinine, Ser: 0.51 mg/dL (ref 0.4–1.2)
Potassium: 3.4 mEq/L — ABNORMAL LOW (ref 3.5–5.1)
Sodium: 137 mEq/L (ref 135–145)

## 2011-01-03 LAB — DIFFERENTIAL
Basophils Relative: 0 % (ref 0–1)
Eosinophils Absolute: 0.1 10*3/uL (ref 0.0–0.7)
Monocytes Absolute: 0.4 10*3/uL (ref 0.1–1.0)
Monocytes Relative: 4 % (ref 3–12)

## 2011-01-03 LAB — CBC
HCT: 39.8 % (ref 36.0–46.0)
MCV: 85.2 fL (ref 78.0–100.0)
Platelets: 280 10*3/uL (ref 150–400)
Platelets: 247 10*3/uL (ref 150–400)
RBC: 4.58 MIL/uL (ref 3.87–5.11)
RBC: 4.33 MIL/uL (ref 3.87–5.11)
RDW: 15.3 % (ref 11.5–15.5)
RDW: 14.9 % (ref 11.5–15.5)
WBC: 9.4 10*3/uL (ref 4.0–10.5)
WBC: 4.3 10*3/uL (ref 4.0–10.5)

## 2011-01-03 LAB — URINALYSIS, ROUTINE W REFLEX MICROSCOPIC
Nitrite: NEGATIVE
Protein, ur: NEGATIVE mg/dL
Urobilinogen, UA: 0.2 mg/dL (ref 0.0–1.0)

## 2011-01-03 LAB — COMPREHENSIVE METABOLIC PANEL
ALT: 22 U/L (ref 0–35)
Albumin: 4.2 g/dL (ref 3.5–5.2)
Alkaline Phosphatase: 53 U/L (ref 39–117)
Glucose, Bld: 90 mg/dL (ref 70–99)
Potassium: 3.5 mEq/L (ref 3.5–5.1)
Sodium: 141 mEq/L (ref 135–145)
Total Protein: 7.2 g/dL (ref 6.0–8.3)

## 2011-01-03 LAB — URINE MICROSCOPIC-ADD ON

## 2011-01-03 LAB — NO BLOOD PRODUCTS

## 2011-01-04 LAB — URINE MICROSCOPIC-ADD ON

## 2011-01-04 LAB — CBC
Hemoglobin: 12.5 g/dL (ref 12.0–15.0)
Hemoglobin: 12.5 g/dL (ref 12.0–15.0)
MCH: 28.8 pg (ref 26.0–34.0)
MCH: 28.8 pg (ref 26.0–34.0)
MCHC: 33.3 g/dL (ref 30.0–36.0)
MCHC: 33.9 g/dL (ref 30.0–36.0)
Platelets: 282 10*3/uL (ref 150–400)
Platelets: 309 10*3/uL (ref 150–400)
RBC: 4.32 MIL/uL (ref 3.87–5.11)
RDW: 15.1 % (ref 11.5–15.5)

## 2011-01-04 LAB — DIFFERENTIAL
Eosinophils Absolute: 0.1 10*3/uL (ref 0.0–0.7)
Eosinophils Relative: 1 % (ref 0–5)
Eosinophils Relative: 1 % (ref 0–5)
Lymphocytes Relative: 39 % (ref 12–46)
Lymphocytes Relative: 49 % — ABNORMAL HIGH (ref 12–46)
Lymphs Abs: 3.2 10*3/uL (ref 0.7–4.0)
Lymphs Abs: 3.7 10*3/uL (ref 0.7–4.0)
Monocytes Absolute: 0.6 10*3/uL (ref 0.1–1.0)

## 2011-01-04 LAB — URINALYSIS, ROUTINE W REFLEX MICROSCOPIC
Bilirubin Urine: NEGATIVE
Bilirubin Urine: NEGATIVE
Glucose, UA: NEGATIVE mg/dL
Hgb urine dipstick: NEGATIVE
Nitrite: NEGATIVE
Specific Gravity, Urine: 1.018 (ref 1.005–1.030)
Specific Gravity, Urine: 1.026 (ref 1.005–1.030)
pH: 7 (ref 5.0–8.0)

## 2011-01-04 LAB — COMPREHENSIVE METABOLIC PANEL
ALT: 34 U/L (ref 0–35)
AST: 19 U/L (ref 0–37)
AST: 23 U/L (ref 0–37)
Albumin: 3.6 g/dL (ref 3.5–5.2)
CO2: 24 mEq/L (ref 19–32)
CO2: 27 mEq/L (ref 19–32)
Calcium: 8.9 mg/dL (ref 8.4–10.5)
Calcium: 9.2 mg/dL (ref 8.4–10.5)
Chloride: 105 mEq/L (ref 96–112)
Creatinine, Ser: 0.61 mg/dL (ref 0.4–1.2)
Creatinine, Ser: 0.67 mg/dL (ref 0.4–1.2)
GFR calc Af Amer: >60 mL/min (ref >60)
GFR calc Af Amer: >60 mL/min (ref >60)
GFR calc non Af Amer: >60 mL/min (ref >60)
Sodium: 140 mEq/L (ref 135–145)
Total Protein: 6.9 g/dL (ref 6.0–8.3)

## 2011-01-04 LAB — URINE CULTURE

## 2011-01-04 LAB — LIPASE, BLOOD: Lipase: 33 U/L (ref 11–59)

## 2011-01-04 LAB — POCT PREGNANCY, URINE: Preg Test, Ur: NEGATIVE

## 2011-01-08 LAB — CBC
MCHC: 33.3 g/dL (ref 30.0–36.0)
Platelets: 269 10*3/uL (ref 150–400)
RBC: 4.41 MIL/uL (ref 3.87–5.11)
RDW: 18.7 % — ABNORMAL HIGH (ref 11.5–15.5)
WBC: 4.5 10*3/uL (ref 4.0–10.5)

## 2011-01-22 LAB — CBC
HCT: 19.6 % — ABNORMAL LOW (ref 36.0–46.0)
MCHC: 31.2 g/dL (ref 30.0–36.0)
MCV: 76.4 fL — ABNORMAL LOW (ref 78.0–100.0)
Platelets: 288 10*3/uL (ref 150–400)
RBC: 3.55 MIL/uL — ABNORMAL LOW (ref 3.87–5.11)
RDW: 16.6 % — ABNORMAL HIGH (ref 11.5–15.5)
RDW: 17.2 % — ABNORMAL HIGH (ref 11.5–15.5)
WBC: 8.1 10*3/uL (ref 4.0–10.5)

## 2011-01-22 LAB — RPR: RPR Ser Ql: NONREACTIVE

## 2011-01-23 LAB — RPR: RPR Ser Ql: NONREACTIVE

## 2011-01-23 LAB — WET PREP, GENITAL
Trich, Wet Prep: NONE SEEN
Yeast Wet Prep HPF POC: NONE SEEN

## 2011-01-23 LAB — CBC
Hemoglobin: 8.9 g/dL — ABNORMAL LOW (ref 12.0–15.0)
Hemoglobin: 8.9 g/dL — ABNORMAL LOW (ref 12.0–15.0)
MCV: 76 fL — ABNORMAL LOW (ref 78.0–100.0)
Platelets: 333 10*3/uL (ref 150–400)
RBC: 3.7 MIL/uL — ABNORMAL LOW (ref 3.87–5.11)
RDW: 16.3 % — ABNORMAL HIGH (ref 11.5–15.5)
WBC: 7.1 10*3/uL (ref 4.0–10.5)
WBC: 7.7 10*3/uL (ref 4.0–10.5)

## 2011-01-23 LAB — URINALYSIS, DIPSTICK ONLY
Bilirubin Urine: NEGATIVE
Glucose, UA: NEGATIVE mg/dL
Hgb urine dipstick: NEGATIVE
Specific Gravity, Urine: 1.01 (ref 1.005–1.030)

## 2011-01-24 LAB — CBC
Platelets: 335 10*3/uL (ref 150–400)
WBC: 6.8 10*3/uL (ref 4.0–10.5)

## 2011-01-24 LAB — RPR: RPR Ser Ql: NONREACTIVE

## 2011-01-28 LAB — URINALYSIS, ROUTINE W REFLEX MICROSCOPIC
Nitrite: NEGATIVE
Specific Gravity, Urine: 1.025 (ref 1.005–1.030)
Urobilinogen, UA: 1 mg/dL (ref 0.0–1.0)

## 2011-01-28 LAB — COMPREHENSIVE METABOLIC PANEL
Albumin: 3.1 g/dL — ABNORMAL LOW (ref 3.5–5.2)
BUN: 5 mg/dL — ABNORMAL LOW (ref 6–23)
Calcium: 9.1 mg/dL (ref 8.4–10.5)
Creatinine, Ser: 0.39 mg/dL — ABNORMAL LOW (ref 0.4–1.2)
Glucose, Bld: 86 mg/dL (ref 70–99)
Total Protein: 6.5 g/dL (ref 6.0–8.3)

## 2011-01-28 LAB — CBC
HCT: 31.1 % — ABNORMAL LOW (ref 36.0–46.0)
Hemoglobin: 10.8 g/dL — ABNORMAL LOW (ref 12.0–15.0)
MCHC: 34.6 g/dL (ref 30.0–36.0)
Platelets: 261 10*3/uL (ref 150–400)
RDW: 17.2 % — ABNORMAL HIGH (ref 11.5–15.5)

## 2011-01-28 LAB — GC/CHLAMYDIA PROBE AMP, GENITAL
Chlamydia, DNA Probe: NEGATIVE
GC Probe Amp, Genital: NEGATIVE

## 2011-01-28 LAB — WET PREP, GENITAL
Clue Cells Wet Prep HPF POC: NONE SEEN
Trich, Wet Prep: NONE SEEN
Yeast Wet Prep HPF POC: NONE SEEN

## 2011-03-05 NOTE — Discharge Summary (Signed)
NAMECHASTY, RANDAL               ACCOUNT NO.:  [id number]   MEDICAL RECORD NO.:  [id number]          PATIENT TYPE:  INP   LOCATION:  5121                         FACILITY:  [facility]   PHYSICIAN:  Pearlean Brownie, M.D.DATE OF BIRTH:  Feb 23, 1981   DATE OF ADMISSION:  03/06/2008  DATE OF DISCHARGE:  03/07/2008                               DISCHARGE SUMMARY   PRIMARY CARE Jermain Curt:  Gentry Fitz.   PROCEDURES:  None.   REASON FOR ADMISSION:  The patient is a 30 year old with history of  hypertension and spina bifida who was admitted for sore throat and  severe lymphadenitis.   DISCHARGE DIAGNOSES:  1. Pharyngitis.  2. Lymphadenitis.  3. Hypertension.  4. History of spina bifida.   LABS AND STUDIES:  Electrolytes included sodium 137, potassium of 3.0,  chloride of 98, creatinine of 0.7, glucose of 100.  CBC showed white  blood cell count of 14.4 and hemoglobin 14.  Monospot was negative.  Rapid strep was negative.  Urine culture done,  of note, the patient had  been in the ED several days before and diagnosed with pyelonephritis and  left otitis media at that time she had a urine culture that grew out  multiple morphotypes and from a UA that had many epithelial cells.  The  CT of the neck was done that showed diffuse lymphadenopathy involving  Waldeyer ring, the palatine tonsils, lingual tonsils and adenoids.  There is also anterior cervical lymphadenopathy.  No abscess was seen.  Differential diagnosis includes lymphoma and infection.  Studies of UA  showed specific gravity of 1.046,  ketones of over 80, large blood, and  100 of protein.  Micro showed 0-2 white blood cells and 7-10 and red  blood cells with few squamous.  Urine culture is pending.   DISCHARGE MEDICATIONS:  The patient was discharged on the following  medications;  1. Augmentin 875 mg b.i.d. x10 days.  2. Over-the-counter Tylenol.  3. Ibuprofen as needed for pain.   The patient instructed to drink plenty of  fluids and stay hydrated.  The  patient was told to stop taking her usual dose of hydrochlorothiazide  and Norvasc and was told to go to her OB who currently prescribed those  medicines.  At the end of the week for blood pressure check and they can  decide whether to continue those medications or not.  As the patient's  blood pressure has been low during her stay in the hospital.   HOSPITAL COURSE BY PROBLEMS:  1. Sore throat, pharyngitis, and lymphadenopathy resulted in the CT      neck are listed above, most likely this is an infection process as      the onset was sudden and the lymphadenopathy is tender to      palpation.  Augmentin was started.  The patient was given IV      fluids.  She began to feel better.  Additionally 1 large lymph node      that was palpated.  On admission, it was not palpated the following      morning, so it is possible that  it is drained or that there was      improvement with Augmentin .  Rapid strep and monospot were      negative as mentioned previously.  The patient was instructed to      come back to the emergency room or urgent care if she is not better      in 4-5 days.  2. Left otitis media.  This was diagnosed several days prior to      admission.  This should be covered by Augmentin.  Of note on exam      this admission, her tympanic membranes appeared normal.  3. Recent urinary tract infection with pyelonephritis and it appears      that the UA was contaminated, however, when she has her followup      appoint with [facility] she will need a UA and culture.      Additionally, the patient has been instructed that if she has any      symptoms of dysuria or flank pain she should go back to the urgent      care or emergency room.  4. Hypertension.  The patient was actually with lower blood pressures      during her hospital stay in the systolic in the 90s and diastolics      in the 50s, so we told her to hold her blood pressure medicine and       instructed her to go to [facility] who currently prescribes      her blood pressure medicines for blood pressure check at the end of      the week and they can decide if it is appropriate to restart those      medicines.  5. Dehydration.  The patient's urine was very dehydrated.  She was      started on IV fluids by the time of discharge she was able to      tolerate some liquids and a little bit of solid food.   The patient condition at the time of discharge is stable.  Pending test  results at the time of discharge , urine culture.   DISPOSITION:  The patient is discharged home.   DISCHARGE FOLLOWUP:  The patient was set with an appointment at  [facility], unfortunately they are not able to see until June 10 at  11:45.  She is to see Dr. Alfonse Ras.  She was instructed to make an appoint  with [facility] for blood pressure check at the end of the week  and told if her symptoms do not improve or if they worsen to come to  [facility] or emergency department.   FOLLOWUP ISSUES:  1. Followup of her lymphadenitis to ensure that it is improving and      lymphomas as one of the thing from the differential diagnosis.  We      do not think that this represents lymphoma, but if she does not      improve further investigation may be needed.  2. Blood pressure.  The patient's blood pressure medicines will      probably need to be added back on as she is taking in more fluids.  3. History of pyelo.  The patient is being discharged on Augmentin      which does not provide great coverage for urinary tract infection.      Her urine culture from her previous ED visit is nonimpressive for  an infection as it has multiple morphotypes, but the patient should      definitely seek medical care if she begins to have any flank pain      or dysuria.  I think it would be advisable for the patient to a      repeat urine culture either at [facility] or at her appointment at       [facility].      Asher Muir, MD  Electronically Signed      Pearlean Brownie, M.D.  Electronically Signed   SO/MEDQ  D:  03/07/2008  T:  03/08/2008  Job:  [id number]   cc:   Sharin Grave, MD

## 2011-03-05 NOTE — H&P (Signed)
NAMESARAHI, [name]               ACCOUNT NO.:  [id number]   MEDICAL RECORD NO.:  [id number]          PATIENT TYPE:  INP   LOCATION:  5121                         FACILITY:  [facility]   PHYSICIAN:  Molly [name], M.D.DATE OF BIRTH:  1981/09/18   DATE OF ADMISSION:  03/06/2008  DATE OF DISCHARGE:                              HISTORY & PHYSICAL   PRIMARY CARE PHYSICIAN:  None.   CHIEF COMPLAINT:  Sore throat and malaise.   HISTORY OF PRESENT ILLNESS:  This is a 30 year old African American  female who has a history of hypertension and spina bifida who started  with left flank pain 3 days ago.  She was seen in the emergency room 2  days ago and diagnosed with left pyelonephritis based on her urinalysis  as well as left otitis media.  She was sent home on ciprofloxacin.  In  reviewing her records; however, her urinalysis had many epithelial cells  and the urine culture is growing multiple morphotype.  She comes in  today because she has a sore throat that is so bad that she cannot  swallow any solids, but is however tolerating liquids .  In the  emergency room, she had a lateral neck film with a widened  retropharyngeal space when the radiologist recommended a followup neck  CT.  We were asked to admit the patient for pain control, IV fluids,  antibiotics if deemed necessary and to rule out any retropharyngeal or  peritonsillar abscess.   REVIEW OF SYSTEMS:  Significant for fevers and chills, generalized  weakness, and left flank pain that has now resolved, and bilateral ear  pain, but is otherwise negative.   PAST MEDICAL HISTORY:  1. Spina bifida.  2. Hypertension.  3. DVT while she was pregnant 60 years ago.   ALLERGIES:  None.   SOCIAL HISTORY:  The patient is unmarried.  She lives with her boyfriend  and her children.  She does not work.  She admits to tobacco abuse.   HOME MEDICATIONS:  Hydrochlorothiazide and Norvasc, dosages unknown.   PHYSICAL EXAMINATION:  VITAL  SIGNS:  Temperature 98.0, blood pressure  106/88, pulse 124, respirations 20.  She is 98% on room air.  GENERAL:  She appears to be ill.  She has soft speech and is lying in  this ED stretcher with a blanket over her head.  HEENT:  Her pharynx is erythematous with exudate and enlarged tonsils.  Her tympanic membranes are normal bilaterally.  NECK:  Supple.  She has enlarged lymph nodes especially on the right  side and the largest one being approximately to 2-3 cm in diameter.  The  right side of the neck is tender to palpation and the left side is not.  CARDIOVASCULAR:  Heart rate is regular and without murmur.  LUNGS:  Clear to auscultation bilaterally with normal respiratory  effort.  ABDOMEN:  Soft, nontender, nondistended, and no masses are palpated.  EXTREMITIES: Reveal no edema.   LABS:  Sodium 137, potassium 3, chloride 98, creatinine 0.7, glucose  100.  Her white count is 14.4 with an absolute neutrophil count of 12,  hemoglobin of 14, and platelets of 215.  Monospot's screen is negative.  Rapid strep screen is negative.  On review of her urine culture from May  15, that is growing multiple morphotype with greater than 100,000  colonies.   ASSESSMENT:  This is a 30 year old with a recent diagnoses of left  otitis media and pyelonephritis with a contaminated urine culture who  presents for increasing sore throat with difficulty swallowing solids.   PROBLEM LIST:  1. Sore throat.  Given that her neck x-rays showed a widened      oropharyngeal space, we will go ahead and obtain a neck CT to rule      out any abscess.  We will start Augmentin and change to Zosyn if      there is an abscess visualized on the CT scan.  Her rapid strep and      monospot test are both negative and this could all be from a viral      process.  2. Left otitis media.  Her examination today is normal.  This will be      covered by on starting Augmentin.  3. Recent UTI/pyelonephritis.  Given that her  urinalysis was likely an      unclean catch with multiple epithelial cells and several morphotype      growing culture, we will obtain a clean catch urinalysis and      reculture her urine.  Her abdominal pain and flank pain is now      resolved per her report.  4. Hypertension.  We will hold her Norvasc and hydrochlorothiazide and      monitor her blood pressure.  5. Dehydration.  She is tachycardic to 124 on exam, although      clinically she does not appear dehydrated.  She is tolerating oral      fluids; however, we will go ahead and start normal saline at 100 mL      per hour and monitor her oral intake and volume status.  We will      also go ahead and repeat her potassium while in the emergency room.      Sylvan Cheese, M.D.  Electronically Signed      Molly [name], M.D.  Electronically Signed    MJ/MEDQ  D:  03/06/2008  T:  03/07/2008  Job:  [id number]

## 2011-07-15 LAB — INFLUENZA A+B VIRUS AG-DIRECT(RAPID): Influenza B Ag: NEGATIVE

## 2011-07-17 LAB — URINALYSIS, ROUTINE W REFLEX MICROSCOPIC
Hgb urine dipstick: NEGATIVE
Ketones, ur: >80 — AB
Leukocytes, UA: NEGATIVE
Nitrite: NEGATIVE
Protein, ur: 30 — AB
Urobilinogen, UA: 2 — ABNORMAL HIGH
Urobilinogen, UA: 4 — ABNORMAL HIGH
pH: 8

## 2011-07-17 LAB — DIFFERENTIAL
Basophils Absolute: 0
Basophils Relative: 0
Eosinophils Absolute: 0
Eosinophils Relative: 0
Lymphocytes Relative: 19
Lymphocytes Relative: 7 — ABNORMAL LOW
Lymphs Abs: 0.7
Lymphs Abs: 1.8
Monocytes Absolute: 0.8
Monocytes Relative: 9
Neutro Abs: 12.4 — ABNORMAL HIGH
Neutro Abs: 6.8
Neutrophils Relative %: 72
Neutrophils Relative %: 86 — ABNORMAL HIGH

## 2011-07-17 LAB — COMPREHENSIVE METABOLIC PANEL
ALT: 26
BUN: 5 — ABNORMAL LOW
CO2: 25
Calcium: 9.5
GFR calc non Af Amer: >60
Glucose, Bld: 103 — ABNORMAL HIGH
Sodium: 136

## 2011-07-17 LAB — URINE CULTURE
Colony Count: >=100000
Colony Count: >=100000

## 2011-07-17 LAB — CBC
HCT: 43.8
Hemoglobin: 13.1
Hemoglobin: 15.1 — ABNORMAL HIGH
MCHC: 34.4
MCV: 88.9
Platelets: 215
RBC: 4.23
RBC: 4.93
RDW: 13
WBC: 9.5

## 2011-07-17 LAB — POCT I-STAT, CHEM 8
BUN: 4 — ABNORMAL LOW
Chloride: 98
Sodium: 137
TCO2: 29

## 2011-07-17 LAB — POCT PREGNANCY, URINE: Preg Test, Ur: NEGATIVE

## 2011-07-17 LAB — MONONUCLEOSIS SCREEN: Mono Screen: NEGATIVE

## 2011-07-17 LAB — BASIC METABOLIC PANEL
CO2: 30
Calcium: 9
GFR calc Af Amer: >60
GFR calc non Af Amer: >60
Sodium: 139

## 2011-07-17 LAB — RAPID STREP SCREEN (MED CTR MEBANE ONLY): Streptococcus, Group A Screen (Direct): NEGATIVE

## 2011-07-17 LAB — LIPASE, BLOOD: Lipase: 14

## 2011-07-17 LAB — URINE MICROSCOPIC-ADD ON

## 2011-08-05 LAB — CBC
HCT: 27 — ABNORMAL LOW
HCT: 30.3 — ABNORMAL LOW
Hemoglobin: 10.2 — ABNORMAL LOW
Hemoglobin: 9.2 — ABNORMAL LOW
MCHC: 34
MCV: 87.2
RBC: 3.09 — ABNORMAL LOW
RBC: 3.4 — ABNORMAL LOW
RDW: 15.3 — ABNORMAL HIGH

## 2011-08-07 LAB — URINALYSIS, ROUTINE W REFLEX MICROSCOPIC
Bilirubin Urine: NEGATIVE
Bilirubin Urine: NEGATIVE
Glucose, UA: NEGATIVE
Glucose, UA: NEGATIVE
Hgb urine dipstick: NEGATIVE
Hgb urine dipstick: NEGATIVE
Ketones, ur: NEGATIVE
Ketones, ur: NEGATIVE
Protein, ur: NEGATIVE
Specific Gravity, Urine: 1.01
pH: 6.5

## 2011-08-07 LAB — CBC
Hemoglobin: 10.5 — ABNORMAL LOW
MCHC: 33.3
MCV: 87.7
RBC: 3.6 — ABNORMAL LOW
WBC: 6.8

## 2012-01-15 ENCOUNTER — Emergency Department (HOSPITAL_COMMUNITY)
Admission: EM | Admit: 2012-01-15 | Discharge: 2012-01-15 | Disposition: A | Discharge status: Home / Self Care | Payer: Self-pay | Attending: Emergency Medicine | Admitting: Emergency Medicine

## 2012-01-15 ENCOUNTER — Encounter (HOSPITAL_COMMUNITY): Payer: Self-pay | Admitting: Emergency Medicine

## 2012-01-15 DIAGNOSIS — R220 Localized swelling, mass and lump, head: Secondary | ICD-10-CM | POA: Insufficient documentation

## 2012-01-15 DIAGNOSIS — H9209 Otalgia, unspecified ear: Secondary | ICD-10-CM | POA: Insufficient documentation

## 2012-01-15 DIAGNOSIS — K029 Dental caries, unspecified: Secondary | ICD-10-CM | POA: Insufficient documentation

## 2012-01-15 DIAGNOSIS — I10 Essential (primary) hypertension: Secondary | ICD-10-CM | POA: Insufficient documentation

## 2012-01-15 DIAGNOSIS — K089 Disorder of teeth and supporting structures, unspecified: Secondary | ICD-10-CM | POA: Insufficient documentation

## 2012-01-15 HISTORY — DX: Essential (primary) hypertension: I10

## 2012-01-15 MED ORDER — PENICILLIN V POTASSIUM 500 MG PO TABS
500.0000 mg | ORAL_TABLET | Freq: Once | ORAL | Status: AC
  Administered 2012-01-15: 500 mg via ORAL
  Filled 2012-01-15: qty 1

## 2012-01-15 MED ORDER — PENICILLIN V POTASSIUM 500 MG PO TABS: 500.0000 mg | ORAL_TABLET | Freq: Four times a day (QID) | ORAL | Status: DC

## 2012-01-15 MED ORDER — TRAMADOL HCL 50 MG PO TABS
50.0000 mg | ORAL_TABLET | Freq: Once | ORAL | Status: AC
  Administered 2012-01-15: 50 mg via ORAL
  Filled 2012-01-15: qty 1

## 2012-01-15 MED ORDER — TRAMADOL HCL 50 MG PO TABS: 50.0000 mg | ORAL_TABLET | Freq: Four times a day (QID) | ORAL | Status: AC | PRN

## 2012-01-15 NOTE — ED Notes (Signed)
Pt states she has a toothache on the bottom right side causing her to have pain in her face, ear and throat on that side  Pain started last night

## 2012-01-15 NOTE — Discharge Instructions (Signed)
Dental Caries Dental caries (cavities) are areas of tooth decay. Cavities are usually caused by a combination of poor dental care; sugar; tobacco, alcohol, and drug abuse; decreased saliva production; and receding gums. If cavities are not treated by a dentist, they grow in size. This can cause toothaches, infection, and loss of the tooth. Cavities of the outer tooth enamel do not cause symptoms. Dental pain from cold drinks may be the first sign the enamel has broken down and decay has spread toward the root of the tooth. This can cause the tooth to die or become infected. If a cavity is treated before it causes toothache, the tooth can usually be saved. Cavities can be prevented by good oral hygiene. Brushing your teeth in the morning and before bed, and using dental floss once daily helps remove plaque and reduce bacteria. Candy, soft drinks, and other sources of sugar promote tooth decay by promoting the growth of bacteria in the mouth. Proper diet, fluoride, dental cleaning, and fillings are important in preventing the loss of teeth from decay. Antibiotics, root canal treatment, or dental extraction may be needed if the decay is severe. Take any pain medication or antibiotics as directed by your caregiver. It is important that you follow up with a dentist for definitive care. SEEK MEDICAL CARE IF:   You or your child has an oral temperature above 102 F (38.9 C).   There is difficulty opening the mouth.   There is difficulty swallowing or handling secretions.   There is difficulty breathing.   There is chest pain.   There are worsening or concerning symptoms.  Document Released: 11/14/2004 Document Revised: 09/26/2011 Document Reviewed: 01/30/2010 [facility] Patient Information 2012 [place], [place].Dental Pain Toothache is pain in or around a tooth. It may get worse with chewing or with cold or heat.  HOME CARE  Your dentist may use a numbing medicine during treatment. If so, you may need  to avoid eating until the medicine wears off. Ask your dentist about this.   Only take medicine as told by your dentist or doctor.   Avoid chewing food near the painful tooth until after all treatment is done. Ask your dentist about this.  GET HELP RIGHT AWAY IF:   The problem gets worse or new problems appear.   You have a fever.   There is redness and puffiness (swelling) of the face, jaw, or neck.   You cannot open your mouth.   There is pain in the jaw.   There is very bad pain that is not helped by medicine.  MAKE SURE YOU:   Understand these instructions.   Will watch your condition.   Will get help right away if you are not doing well or get worse.  Document Released: 03/25/2008 Document Revised: 09/26/2011 Document Reviewed: 03/25/2008 [facility] Patient Information 2012 [place], [place]. As discussed.  It is very important that you call the dentist first thing in the morning to set an appointment if you do not call tomorrow.  We will measure window of opportunity to be seen by Dr. Leanord Asal

## 2012-01-15 NOTE — ED Provider Notes (Signed)
History     CSN: [id number]  Arrival date & time 01/15/12  2039   First MD Initiated Contact with Patient 01/15/12 2223      Chief Complaint  Patient presents with  . Dental Pain    (Consider location/radiation/quality/duration/timing/severity/associated sxs/prior treatment) HPI Comments: Patient noticed yesterday.  She had right lower gum pain over the partially erupted wisdom teeth, as well as the second molar and surrounding gum  Patient is a 31 y.o. female presenting with tooth pain. The history is provided by the patient.  Dental PainThe primary symptoms include mouth pain. Primary symptoms do not include headaches or fever. The symptoms began yesterday. The symptoms are worsening. The symptoms occur constantly.  Additional symptoms include: gum swelling, gum tenderness and ear pain.    Past Medical History  Diagnosis Date  . Hypertension     Past Surgical History  Procedure Date  . Cholecystectomy     Family History  Problem Relation Age of Onset  . Hypertension Other     History  Substance Use Topics  . Smoking status: Current Everyday Smoker    Types: Cigarettes  . Smokeless tobacco: Not on file  . Alcohol Use: Yes     seldom    OB History    Grav Para Term Preterm Abortions TAB SAB Ect Mult Living                  Review of Systems  Constitutional: Negative for fever and chills.  HENT: Positive for ear pain and dental problem.   Neurological: Negative for dizziness and headaches.    Allergies  Review of patient's allergies indicates no known allergies.  Home Medications  No current outpatient prescriptions on file.  BP 126/76  Pulse 78  Temp(Src) 98.2 F (36.8 C) (Oral)  Resp 18  SpO2 100%  LMP 01/12/2012  Physical Exam  Constitutional: She appears well-developed and well-nourished.  HENT:  Head: Normocephalic.  Mouth/Throat:    Eyes: Pupils are equal, round, and reactive to light.  Neck: Normal range of motion.    Cardiovascular: Normal rate.   Pulmonary/Chest: Effort normal.  Musculoskeletal: Normal range of motion.  Neurological: She is alert.  Skin: Skin is warm.    ED Course  Procedures (including critical care time)  Labs Reviewed - No data to display No results found.   No diagnosis found.    MDM  A chest partially erupted wisdom tooth was a large cavity in the posterior portion of the last molar on the right bottom       Arman Filter, NP 01/15/12 2237  Arman Filter, NP 01/15/12 2238

## 2012-01-16 NOTE — ED Provider Notes (Signed)
Medical screening examination/treatment/procedure(s) were performed by non-physician practitioner and as supervising physician I was immediately available for consultation/collaboration.    Edan Serratore L Adler Alton, MD 01/16/12 2340 

## 2012-01-17 ENCOUNTER — Encounter (HOSPITAL_COMMUNITY): Payer: Self-pay | Admitting: Emergency Medicine

## 2012-01-17 ENCOUNTER — Emergency Department (HOSPITAL_COMMUNITY)
Admission: EM | Admit: 2012-01-17 | Discharge: 2012-01-17 | Disposition: A | Discharge status: Home / Self Care | Payer: Self-pay | Attending: Emergency Medicine | Admitting: Emergency Medicine

## 2012-01-17 DIAGNOSIS — I10 Essential (primary) hypertension: Secondary | ICD-10-CM | POA: Insufficient documentation

## 2012-01-17 DIAGNOSIS — L500 Allergic urticaria: Secondary | ICD-10-CM | POA: Insufficient documentation

## 2012-01-17 DIAGNOSIS — R21 Rash and other nonspecific skin eruption: Secondary | ICD-10-CM | POA: Insufficient documentation

## 2012-01-17 MED ORDER — KETOROLAC TROMETHAMINE 60 MG/2ML IM SOLN
60.0000 mg | Freq: Once | INTRAMUSCULAR | Status: AC
  Administered 2012-01-17: 60 mg via INTRAMUSCULAR
  Filled 2012-01-17: qty 2

## 2012-01-17 MED ORDER — HYDROCODONE-ACETAMINOPHEN 5-500 MG PO TABS: 1.0000 | ORAL_TABLET | Freq: Four times a day (QID) | ORAL | Status: AC | PRN

## 2012-01-17 MED ORDER — DIPHENHYDRAMINE HCL 25 MG PO CAPS: 25.0000 mg | ORAL_CAPSULE | Freq: Four times a day (QID) | ORAL | Status: DC | PRN

## 2012-01-17 MED ORDER — FAMOTIDINE 20 MG PO TABS: 20.0000 mg | ORAL_TABLET | Freq: Two times a day (BID) | ORAL | Status: DC

## 2012-01-17 MED ORDER — CLINDAMYCIN HCL 150 MG PO CAPS: 300.0000 mg | ORAL_CAPSULE | Freq: Three times a day (TID) | ORAL | Status: AC

## 2012-01-17 NOTE — ED Notes (Signed)
Pt states she was here Wednesday evening and has an abscess tooth - was given PCN and Tramadol.  Took both Rx yesterday morning and the rash appeared shortly thereafter.

## 2012-01-17 NOTE — ED Provider Notes (Signed)
History     CSN: [id number]  Arrival date & time 01/17/12  1251   First MD Initiated Contact with Patient 01/17/12 1340      Chief Complaint  Patient presents with  . Rash    (Consider location/radiation/quality/duration/timing/severity/associated sxs/prior treatment) Patient is a 31 y.o. female presenting with rash. The history is provided by the patient.  Rash  This is a new problem. The current episode started yesterday. The problem has not changed since onset. States she was seen here two days ago for abscessed tooth. Was given penicillin and ultram. States yesterday broke out in hives all over. States itchy. Denies swelling of lips, tongue, throat. Did not take any medications for the rash because did not know what to take. Did not take her medications today. States tooth continues to hurt and is swollen, has appt with oral surgery in 4 days.  Past Medical History  Diagnosis Date  . Hypertension     Past Surgical History  Procedure Date  . Cholecystectomy     Family History  Problem Relation Age of Onset  . Hypertension Other     History  Substance Use Topics  . Smoking status: Current Everyday Smoker    Types: Cigarettes  . Smokeless tobacco: Not on file  . Alcohol Use: Yes     seldom    OB History    Grav Para Term Preterm Abortions TAB SAB Ect Mult Living                  Review of Systems  Constitutional: Negative for fever and chills.  HENT: Positive for dental problem. Negative for sore throat, facial swelling, trouble swallowing and neck pain.   Eyes: Negative.   Respiratory: Negative.   Cardiovascular: Negative.   Gastrointestinal: Negative.   Genitourinary: Negative.   Musculoskeletal: Negative.   Skin: Positive for rash.  Neurological: Positive for headaches.  Psychiatric/Behavioral: Negative.     Allergies  Penicillins  Home Medications   Current Outpatient Rx  Name Route Sig Dispense Refill  . TRAMADOL HCL 50 MG PO TABS Oral Take  1 tablet (50 mg total) by mouth every 6 (six) hours as needed for pain. 30 tablet 0    BP 122/86  Pulse 85  Temp(Src) 98.9 F (37.2 C) (Oral)  Resp 16  Ht 5\' 4"  (1.626 m)  Wt 156 lb (70.761 kg)  BMI 26.78 kg/m2  SpO2 99%  LMP 01/12/2012  Physical Exam  Nursing note and vitals reviewed. Constitutional: She is oriented to person, place, and time. She appears well-developed and well-nourished. No distress.  HENT:  Head: Normocephalic.       No facial swelling, Right lower 2nd molar swelling, tender to palpation, appears abscessed. No swelling of the lips, tongue, uvula  Eyes: Conjunctivae are normal.  Neck: Normal range of motion. Neck supple.  Cardiovascular: Normal rate, regular rhythm and normal heart sounds.   Pulmonary/Chest: Effort normal and breath sounds normal. No respiratory distress. She has no wheezes. She has no rales.  Abdominal: Soft. Bowel sounds are normal. She exhibits no distension. There is no tenderness.  Musculoskeletal: Normal range of motion.  Lymphadenopathy:    She has no cervical adenopathy.  Neurological: She is alert and oriented to person, place, and time.  Skin: Skin is warm and dry.       Hives over antire body, excoriations noted as well.    ED Course  Procedures (including critical care time)  Pt with urticaria, shortly after taking penicillin  and tramadol. Will stop medications, will start on different antibiotic for abscessed tooth, pain medicines. Pt currently having a headache, itching. She drove, no medications given here that would sedate her. She has no respiratory symptoms no signs of angioedema. Will d/c home. VS normal.   No diagnosis found.    MDM          Lottie Mussel, PA 01/17/12 1406

## 2012-01-17 NOTE — Discharge Instructions (Signed)
For your toothache, stop penicillin and tramadol. Take clindamycin and vicodin as prescribed. Start taking benadryl and pepcid for rash and itching. Follow up with primary care doctor and oral surgeon as needed. Return if swelling to the tongue or difficulty breathing.   Drug Allergy Allergic reactions to medicines are common. Some allergic reactions are mild. A delayed type of drug allergy that occurs 1 week or more after exposure to a medicine or vaccine is called serum sickness. A life-threatening, sudden (acute) allergic reaction that involves the whole body is called anaphylaxis. CAUSES  "True" drug allergies occur when there is an allergic reaction to a medicine. This is caused by overactivity of the immune system. First, the body becomes sensitized. The immune system is triggered by your first exposure to the medicine. Following this first exposure, future exposure to the same medicine may be life-threatening. Almost any medicine can cause an allergic reaction. Common ones are:  Penicillin.   Sulfonamides (sulfa drugs).   Local anesthetics.   X-ray dyes that contain iodine.  SYMPTOMS  Common symptoms of a minor allergic reaction are:  Swelling around the mouth.   An itchy red rash or hives.   Vomiting or diarrhea.  Anaphylaxis can cause swelling of the mouth and throat. This makes it difficult to breathe and swallow. Severe reactions can be fatal within seconds, even after exposure to only a trace amount of the drug that causes the reaction. HOME CARE INSTRUCTIONS   If you are unsure of what caused your reaction, keep a diary of foods and medicines used. Include the symptoms that followed. Avoid anything that causes reactions.   You may want to follow up with an allergy specialist after the reaction has cleared in order to be tested to confirm the allergy. It is important to confirm that your reaction is an allergy, not just a side effect to the medicine. If you have a true allergy  to a medicine, this may prevent that medicine and related medicines from being given to you when you are very ill.   If you have hives or a rash:   Take medicines as directed by your caregiver.   You may use an over-the-counter antihistamine (diphenhydramine) as needed.   Apply cold compresses to the skin or take baths in cool water. Avoid hot baths or showers.   If you are severely allergic:   Continuous observation after a severe reaction may be needed. Hospitalization is often required.   Wear a medical alert bracelet or necklace stating your allergy.   You and your family must learn how to use an anaphylaxis kit or give an epinephrine injection to temporarily treat an emergency allergic reaction. If you have had a severe reaction, always carry your epinephrine injection or anaphylaxis kit with you. This can be lifesaving if you have a severe reaction.   Do not drive or perform tasks after treatment until the medicines used to treat your reaction have worn off, or until your caregiver says it is okay.  SEEK MEDICAL CARE IF:   You think you had an allergic reaction. Symptoms usually start within 30 minutes after exposure.   Symptoms are getting worse rather than better.   You develop new symptoms.   The symptoms that brought you to your caregiver return.  SEEK IMMEDIATE MEDICAL CARE IF:   You have swelling of the mouth, difficulty breathing, or wheezing.   You have a tight feeling in your chest or throat.   You develop hives, swelling, or  itching all over your body.   You develop severe vomiting or diarrhea.   You feel faint or pass out.  This is an emergency. Use your epinephrine injection or anaphylaxis kit as you have been instructed. Call for emergency medical help. Even if you improve after the injection, you need to be examined at a hospital emergency department. MAKE SURE YOU:   Understand these instructions.   Will watch your condition.   Will get help right  away if you are not doing well or get worse.  Document Released: 10/07/2005 Document Revised: 09/26/2011 Document Reviewed: 03/13/2011 [facility] Patient Information 2012 [place], [place].

## 2012-01-17 NOTE — ED Provider Notes (Signed)
Medical screening examination/treatment/procedure(s) were performed by non-physician practitioner and as supervising physician I was immediately available for consultation/collaboration.   Keeli Roberg, MD 01/17/12 1524 

## 2012-08-02 ENCOUNTER — Emergency Department (HOSPITAL_COMMUNITY)
Admission: EM | Admit: 2012-08-02 | Discharge: 2012-08-03 | Disposition: A | Discharge status: Home / Self Care | Payer: Self-pay | Attending: Emergency Medicine | Admitting: Emergency Medicine

## 2012-08-02 ENCOUNTER — Encounter (HOSPITAL_COMMUNITY): Payer: Self-pay

## 2012-08-02 ENCOUNTER — Emergency Department (HOSPITAL_COMMUNITY): Payer: Self-pay

## 2012-08-02 DIAGNOSIS — H9209 Otalgia, unspecified ear: Secondary | ICD-10-CM | POA: Insufficient documentation

## 2012-08-02 DIAGNOSIS — R220 Localized swelling, mass and lump, head: Secondary | ICD-10-CM | POA: Insufficient documentation

## 2012-08-02 DIAGNOSIS — R1310 Dysphagia, unspecified: Secondary | ICD-10-CM | POA: Insufficient documentation

## 2012-08-02 DIAGNOSIS — E876 Hypokalemia: Secondary | ICD-10-CM | POA: Insufficient documentation

## 2012-08-02 DIAGNOSIS — R221 Localized swelling, mass and lump, neck: Secondary | ICD-10-CM | POA: Insufficient documentation

## 2012-08-02 DIAGNOSIS — I10 Essential (primary) hypertension: Secondary | ICD-10-CM | POA: Insufficient documentation

## 2012-08-02 DIAGNOSIS — J029 Acute pharyngitis, unspecified: Secondary | ICD-10-CM | POA: Insufficient documentation

## 2012-08-02 DIAGNOSIS — R059 Cough, unspecified: Secondary | ICD-10-CM | POA: Insufficient documentation

## 2012-08-02 DIAGNOSIS — R05 Cough: Secondary | ICD-10-CM | POA: Insufficient documentation

## 2012-08-02 DIAGNOSIS — R599 Enlarged lymph nodes, unspecified: Secondary | ICD-10-CM | POA: Insufficient documentation

## 2012-08-02 DIAGNOSIS — R509 Fever, unspecified: Secondary | ICD-10-CM | POA: Insufficient documentation

## 2012-08-02 HISTORY — DX: Urinary tract infection, site not specified: N39.0

## 2012-08-02 LAB — CBC
MCH: 25.6 pg — ABNORMAL LOW (ref 26.0–34.0)
MCV: 78.3 fL (ref 78.0–100.0)
Platelets: 295 10*3/uL (ref 150–400)
RBC: 4.37 MIL/uL (ref 3.87–5.11)
RDW: 15.3 % (ref 11.5–15.5)

## 2012-08-02 LAB — URINALYSIS, ROUTINE W REFLEX MICROSCOPIC
Bilirubin Urine: NEGATIVE
Ketones, ur: NEGATIVE mg/dL
Nitrite: NEGATIVE
Protein, ur: NEGATIVE mg/dL
Urobilinogen, UA: 0.2 mg/dL (ref 0.0–1.0)
pH: 6.5 (ref 5.0–8.0)

## 2012-08-02 LAB — COMPREHENSIVE METABOLIC PANEL
AST: 57 U/L — ABNORMAL HIGH (ref 0–37)
Alkaline Phosphatase: 70 U/L (ref 39–117)
BUN: 6 mg/dL (ref 6–23)
CO2: 22 mEq/L (ref 19–32)
Chloride: 98 mEq/L (ref 96–112)
Creatinine, Ser: 0.54 mg/dL (ref 0.50–1.10)
GFR calc non Af Amer: >90 mL/min (ref >90)
Potassium: 3 mEq/L — ABNORMAL LOW (ref 3.5–5.1)
Total Bilirubin: 0.5 mg/dL (ref 0.3–1.2)

## 2012-08-02 LAB — URINE MICROSCOPIC-ADD ON

## 2012-08-02 LAB — RAPID STREP SCREEN (MED CTR MEBANE ONLY): Streptococcus, Group A Screen (Direct): NEGATIVE

## 2012-08-02 MED ORDER — SODIUM CHLORIDE 0.9 % IV BOLUS (SEPSIS)
1000.0000 mL | Freq: Once | INTRAVENOUS | Status: AC
  Administered 2012-08-02: 1000 mL via INTRAVENOUS

## 2012-08-02 MED ORDER — SODIUM CHLORIDE 0.9 % IV SOLN
Freq: Once | INTRAVENOUS | Status: AC
  Administered 2012-08-02: 1000 mL via INTRAVENOUS

## 2012-08-02 MED ORDER — HYDROMORPHONE HCL PF 1 MG/ML IJ SOLN
1.0000 mg | Freq: Once | INTRAMUSCULAR | Status: AC
  Administered 2012-08-02: 1 mg via INTRAVENOUS
  Filled 2012-08-02: qty 1

## 2012-08-02 MED ORDER — IOHEXOL 300 MG/ML  SOLN
100.0000 mL | Freq: Once | INTRAMUSCULAR | Status: AC | PRN
  Administered 2012-08-02: 100 mL via INTRAVENOUS

## 2012-08-02 NOTE — ED Notes (Addendum)
Pt stated "feel like my throat is swollen" and "I'm breathing funny." O2 sat currently 100% on RA and pt is in no acute distressed.  MD was notified and came to look at pt. MD ordered a CT of neck.

## 2012-08-02 NOTE — ED Notes (Signed)
Pt presents with NAD- fever since this am-sore throat since this am- urinating very little today- denies N/VD

## 2012-08-02 NOTE — ED Provider Notes (Addendum)
History     CSN: [id number]  Arrival date & time 08/02/12  1954   First MD Initiated Contact with Patient 08/02/12 2102      Chief Complaint  Patient presents with  . Fever  . Sore Throat  . Facial Swelling    (Consider location/radiation/quality/duration/timing/severity/associated sxs/prior treatment) HPI Complains of sore throat and pain with swallowing onset yesterday pain radiates to both ears symptoms accompanied by slight cough and fever 102 this morning. Pain is worse with swallowing not improved by anything. You have ibuprofen without relief. Denies facial swelling. Feels as if her throat is closing.. Past Medical History  Diagnosis Date  . Hypertension   . UTI (lower urinary tract infection)     Past Surgical History  Procedure Date  . Cholecystectomy     Family History  Problem Relation Age of Onset  . Hypertension Other     History  Substance Use Topics  . Smoking status: Current Every Day Smoker    Types: Cigarettes  . Smokeless tobacco: Not on file  . Alcohol Use: Yes     seldom    OB History    Grav Para Term Preterm Abortions TAB SAB Ect Mult Living                  Review of Systems  HENT: Positive for sore throat and trouble swallowing.   Respiratory: Positive for cough.     Allergies  Review of patient's allergies indicates no known allergies.  Home Medications   Current Outpatient Rx  Name Route Sig Dispense Refill  . IBUPROFEN 200 MG PO TABS Oral Take 200 mg by mouth every 6 (six) hours as needed. Pain    . DIPHENHYDRAMINE HCL 25 MG PO CAPS Oral Take 1 capsule (25 mg total) by mouth every 6 (six) hours as needed for itching. 30 capsule 0    BP 122/85  Pulse 119  Temp 100 F (37.8 C) (Oral)  Resp 18  SpO2 99%  LMP 07/18/2012  Physical Exam  Nursing note and vitals reviewed. Constitutional: She appears well-developed and well-nourished. No distress.       handling secretions well  HENT:  Head: Normocephalic and  atraumatic.  Right Ear: External ear normal.  Left Ear: External ear normal.  Mouth/Throat: No oropharyngeal exudate.       Bilateral tympanic membranes normal; pharynx minimally reddened no trismus no hot potato voice  Eyes: Conjunctivae normal are normal. Pupils are equal, round, and reactive to light.  Neck: Neck supple. No tracheal deviation present. No thyromegaly present.  Cardiovascular: Normal rate and regular rhythm.   No murmur heard. Pulmonary/Chest: Effort normal and breath sounds normal. No stridor.  Abdominal: Soft. Bowel sounds are normal. She exhibits no distension. There is no tenderness.       obese  Musculoskeletal: Normal range of motion. She exhibits no edema and no tenderness.  Lymphadenopathy:    She has cervical adenopathy.  Neurological: She is alert. Coordination normal.  Skin: Skin is warm and dry. No rash noted.  Psychiatric: She has a normal mood and affect.    ED Course  Procedures (including critical care time)  Labs Reviewed  CBC - Abnormal; Notable for the following:    WBC 10.9 (*)     Hemoglobin 11.2 (*)     HCT 34.2 (*)     MCH 25.6 (*)     All other components within normal limits  RAPID STREP SCREEN  COMPREHENSIVE METABOLIC PANEL  URINALYSIS, ROUTINE  W REFLEX MICROSCOPIC   No results found.   No diagnosis found.  2200 and Pain improved after treatment with intravenous fluids and intravenous hydromorphone.12 20 a.m. patient resting the pain medicine Lortab elixir ordered 10 5 AM feels improved and would go home she is in no distress   Results for orders placed during the hospital encounter of 08/02/12  RAPID STREP SCREEN      Component Value Range   Streptococcus, Group A Screen (Direct) NEGATIVE  NEGATIVE  COMPREHENSIVE METABOLIC PANEL      Component Value Range   Sodium 133 (*) 135 - 145 mEq/L   Potassium 3.0 (*) 3.5 - 5.1 mEq/L   Chloride 98  96 - 112 mEq/L   CO2 22  19 - 32 mEq/L   Glucose, Bld 117 (*) 70 - 99 mg/dL   BUN 6   6 - 23 mg/dL   Creatinine, Ser 1.91  0.50 - 1.10 mg/dL   Calcium 8.8  8.4 - 47.8 mg/dL   Total Protein 7.5  6.0 - 8.3 g/dL   Albumin 3.9  3.5 - 5.2 g/dL   AST 57 (*) 0 - 37 U/L   ALT 54 (*) 0 - 35 U/L   Alkaline Phosphatase 70  39 - 117 U/L   Total Bilirubin 0.5  0.3 - 1.2 mg/dL   GFR calc non Af Amer >90  >90 mL/min   GFR calc Af Amer >90  >90 mL/min  CBC      Component Value Range   WBC 10.9 (*) 4.0 - 10.5 K/uL   RBC 4.37  3.87 - 5.11 MIL/uL   Hemoglobin 11.2 (*) 12.0 - 15.0 g/dL   HCT 29.5 (*) 62.1 - 30.8 %   MCV 78.3  78.0 - 100.0 fL   MCH 25.6 (*) 26.0 - 34.0 pg   MCHC 32.7  30.0 - 36.0 g/dL   RDW 65.7  84.6 - 96.2 %   Platelets 295  150 - 400 K/uL  URINALYSIS, ROUTINE W REFLEX MICROSCOPIC      Component Value Range   Color, Urine YELLOW  YELLOW   APPearance CLOUDY (*) CLEAR   Specific Gravity, Urine 1.004 (*) 1.005 - 1.030   pH 6.5  5.0 - 8.0   Glucose, UA NEGATIVE  NEGATIVE mg/dL   Hgb urine dipstick MODERATE (*) NEGATIVE   Bilirubin Urine NEGATIVE  NEGATIVE   Ketones, ur NEGATIVE  NEGATIVE mg/dL   Protein, ur NEGATIVE  NEGATIVE mg/dL   Urobilinogen, UA 0.2  0.0 - 1.0 mg/dL   Nitrite NEGATIVE  NEGATIVE   Leukocytes, UA SMALL (*) NEGATIVE  POCT PREGNANCY, URINE      Component Value Range   Preg Test, Ur NEGATIVE  NEGATIVE  URINE MICROSCOPIC-ADD ON      Component Value Range   Squamous Epithelial / LPF MANY (*) RARE   WBC, UA 0-2  <3 WBC/hpf   RBC / HPF 3-6  <3 RBC/hpf   Bacteria, UA FEW (*) RARE   Ct Soft Tissue Neck W Contrast  08/02/2012  *RADIOLOGY REPORT*  Clinical Data: Fever, pain, and swelling to right-sided neck.  CT NECK WITH CONTRAST  Technique:  Multidetector CT imaging of the neck was performed with intravenous contrast.  Contrast: OMNIPAQUE IOHEXOL 300 MG/ML  SOLN  Comparison: 03/06/2008  Findings: Imaged orbits unremarkable.  There is enlargement of the adenoids on image 9.  Enlargement of the palatine tonsils, greater right than left.  No  well-defined abscess.  The epiglottis is  suboptimally evaluated secondary to underdistension at the tongue base.  The aryepiglottic folds are unremarkable.  No evidence of abscess.  Enlarged lateral retropharyngeal nodes, greater on the right than left.  Example 11 mm node on image 14 versus 1.2 cm on the prior.  Clear lung apices.  Normal thyroid gland, submandibular glands, and parotid glands.  Increased number and size of cervical nodes, similar to improved compared to the 2009 exam.  Largest left-sided node is in the jugulo digastric station and measures 1.8 x 1.0 cm on image 23; 2.7 x 1.1 cm on the prior exam. All vascular structures enhance normally. No acute osseous abnormality.  IMPRESSION:  1.  Enlargement of adenoids and palatine tonsils, most consistent with tonsillitis.  No evidence of drainable abscess. 2.  Cervical adenopathy, favored to be reactive. 3.  Suboptimal evaluation of the epiglottis, secondary to underdistension at the tongue base.   Original Report Authenticated By: Consuello Bossier, M.D.     MDM  A CT scan of this patient felt as if her throat was closing. Symptoms consistent with viral illness Antibiotics not of benefit Plan Lortab elixir prescription. Referral to resource guide. Diagnosis#1 acute pharyngitis #2 hypokalemia       Doug Sou, MD 08/03/12 0111  Doug Sou, MD 08/03/12 5409

## 2012-08-03 MED ORDER — HYDROCODONE-ACETAMINOPHEN 7.5-500 MG/15ML PO SOLN
15.0000 mL | ORAL | Status: DC | PRN
Start: 2012-08-03 — End: 2012-12-14

## 2012-08-03 MED ORDER — HYDROCODONE-ACETAMINOPHEN 7.5-500 MG/15ML PO SOLN
15.0000 mL | Freq: Once | ORAL | Status: AC
  Administered 2012-08-03: 15 mL via ORAL
  Filled 2012-08-03: qty 15

## 2012-08-03 MED ORDER — POTASSIUM CHLORIDE 20 MEQ/15ML (10%) PO LIQD
40.0000 meq | Freq: Once | ORAL | Status: AC
  Administered 2012-08-03: 40 meq via ORAL
  Filled 2012-08-03: qty 30

## 2012-08-03 MED ORDER — POTASSIUM CHLORIDE CRYS ER 20 MEQ PO TBCR
40.0000 meq | EXTENDED_RELEASE_TABLET | Freq: Once | ORAL | Status: DC
  Filled 2012-08-03: qty 2

## 2012-08-04 ENCOUNTER — Emergency Department (HOSPITAL_COMMUNITY)
Admission: EM | Admit: 2012-08-04 | Discharge: 2012-08-04 | Disposition: A | Discharge status: Home / Self Care | Payer: Self-pay | Attending: Emergency Medicine | Admitting: Emergency Medicine

## 2012-08-04 ENCOUNTER — Encounter (HOSPITAL_COMMUNITY): Payer: Self-pay

## 2012-08-04 DIAGNOSIS — Z8249 Family history of ischemic heart disease and other diseases of the circulatory system: Secondary | ICD-10-CM | POA: Insufficient documentation

## 2012-08-04 DIAGNOSIS — J029 Acute pharyngitis, unspecified: Secondary | ICD-10-CM | POA: Insufficient documentation

## 2012-08-04 DIAGNOSIS — I10 Essential (primary) hypertension: Secondary | ICD-10-CM | POA: Insufficient documentation

## 2012-08-04 DIAGNOSIS — F17200 Nicotine dependence, unspecified, uncomplicated: Secondary | ICD-10-CM | POA: Insufficient documentation

## 2012-08-04 LAB — RAPID STREP SCREEN (MED CTR MEBANE ONLY): Streptococcus, Group A Screen (Direct): NEGATIVE

## 2012-08-04 MED ORDER — HYDROCODONE-ACETAMINOPHEN 7.5-500 MG/15ML PO SOLN: 15.0000 mL | Freq: Four times a day (QID) | ORAL | Status: DC | PRN

## 2012-08-04 MED ORDER — PREDNISONE 20 MG PO TABS
60.0000 mg | ORAL_TABLET | Freq: Once | ORAL | Status: AC
  Administered 2012-08-04: 60 mg via ORAL
  Filled 2012-08-04: qty 3

## 2012-08-04 MED ORDER — PREDNISONE 20 MG PO TABS: 60.0000 mg | ORAL_TABLET | Freq: Every day | ORAL | Status: DC

## 2012-08-04 MED ORDER — HYDROCODONE-ACETAMINOPHEN 7.5-500 MG/15ML PO SOLN
10.0000 mL | Freq: Once | ORAL | Status: AC
  Administered 2012-08-04: 10 mL via ORAL
  Filled 2012-08-04: qty 15

## 2012-08-04 NOTE — ED Provider Notes (Signed)
History     CSN: [id number]  Arrival date & time 08/04/12  1803   First MD Initiated Contact with Patient 08/04/12 1910      Chief Complaint  Patient presents with  . Fever  . Facial Swelling  . Emesis    (Consider location/radiation/quality/duration/timing/severity/associated sxs/prior treatment) HPI Comments: Patient presents today with a chief complaint of sore throat.  She was seen two days ago for the same.  At that time a CT neck was done, which showed enlarged tonsils consistent with tonsillitis.  No drainable abscess.  She was diagnosed with a viral illness and discharged home with Lortab, which she has been taking.  She states that last evening she had a fever.  No fever today.  She is afebrile in the ED.  Last dose of Lortab was 4-5 hours prior to arrival.  Patient states that she is able to swallow, but that swallowing is painful.    Patient is a 31 y.o. female presenting with pharyngitis. The history is provided by the patient.  Sore Throat This is a new problem. Episode onset: 2 days ago. The problem occurs constantly. The problem has been unchanged. Associated symptoms include chills, a fever and a sore throat. Pertinent negatives include no abdominal pain, congestion, headaches, neck pain or rash. The symptoms are aggravated by swallowing. Treatments tried: Lortab elixir. The treatment provided mild relief.    Past Medical History  Diagnosis Date  . Hypertension   . UTI (lower urinary tract infection)     Past Surgical History  Procedure Date  . Cholecystectomy     Family History  Problem Relation Age of Onset  . Hypertension Other     History  Substance Use Topics  . Smoking status: Current Every Day Smoker    Types: Cigarettes  . Smokeless tobacco: Not on file  . Alcohol Use: Yes     seldom    OB History    Grav Para Term Preterm Abortions TAB SAB Ect Mult Living                  Review of Systems  Constitutional: Positive for fever and  chills.  HENT: Positive for sore throat. Negative for congestion, drooling, trouble swallowing, neck pain, neck stiffness and voice change.   Gastrointestinal: Negative for abdominal pain.  Skin: Negative for rash.  Neurological: Negative for headaches.    Allergies  Review of patient's allergies indicates no known allergies.  Home Medications   Current Outpatient Rx  Name Route Sig Dispense Refill  . HYDROCODONE-ACETAMINOPHEN 7.5-500 MG/15ML PO SOLN Oral Take 15 mLs by mouth every 4 (four) hours as needed for pain. 120 mL 0  . IBUPROFEN 100 MG/5ML PO SUSP Oral Take 300 mg by mouth every 6 (six) hours as needed. For pain.      BP 130/82  Pulse 94  Temp 98.1 F (36.7 C) (Oral)  Resp 18  SpO2 100%  LMP 07/18/2012  Physical Exam  Nursing note and vitals reviewed. Constitutional: She appears well-developed and well-nourished. No distress.  HENT:  Head: Normocephalic and atraumatic. No trismus in the jaw.  Right Ear: Tympanic membrane and ear canal normal.  Left Ear: Tympanic membrane and ear canal normal.  Nose: Nose normal.  Mouth/Throat: Uvula is midline and mucous membranes are normal. No uvula swelling. Oropharyngeal exudate, posterior oropharyngeal edema and posterior oropharyngeal erythema present. No tonsillar abscesses.       Patient handling secretions well No drooling Patient able to swallow water No  changes in voice phonation  Neck: Normal range of motion. Neck supple.  Cardiovascular: Normal rate, regular rhythm and normal heart sounds.   Pulmonary/Chest: Effort normal and breath sounds normal.  Neurological: She is alert.  Skin: Skin is warm and dry. No rash noted. She is not diaphoretic.  Psychiatric: She has a normal mood and affect.    ED Course  Procedures (including critical care time)   Labs Reviewed  RAPID STREP SCREEN   Ct Soft Tissue Neck W Contrast  08/02/2012  *RADIOLOGY REPORT*  Clinical Data: Fever, pain, and swelling to right-sided neck.   CT NECK WITH CONTRAST  Technique:  Multidetector CT imaging of the neck was performed with intravenous contrast.  Contrast: OMNIPAQUE IOHEXOL 300 MG/ML  SOLN  Comparison: 03/06/2008  Findings: Imaged orbits unremarkable.  There is enlargement of the adenoids on image 9.  Enlargement of the palatine tonsils, greater right than left.  No well-defined abscess.  The epiglottis is suboptimally evaluated secondary to underdistension at the tongue base.  The aryepiglottic folds are unremarkable.  No evidence of abscess.  Enlarged lateral retropharyngeal nodes, greater on the right than left.  Example 11 mm node on image 14 versus 1.2 cm on the prior.  Clear lung apices.  Normal thyroid gland, submandibular glands, and parotid glands.  Increased number and size of cervical nodes, similar to improved compared to the 2009 exam.  Largest left-sided node is in the jugulo digastric station and measures 1.8 x 1.0 cm on image 23; 2.7 x 1.1 cm on the prior exam. All vascular structures enhance normally. No acute osseous abnormality.  IMPRESSION:  1.  Enlargement of adenoids and palatine tonsils, most consistent with tonsillitis.  No evidence of drainable abscess. 2.  Cervical adenopathy, favored to be reactive. 3.  Suboptimal evaluation of the epiglottis, secondary to underdistension at the tongue base.   Original Report Authenticated By: Consuello Bossier, M.D.      No diagnosis found.    MDM  Patient presenting with sore throat.  Patient with normal voice phonation.  Patient able to swallow and handling secretions.  Uvula is midline.  No evidence of peritonsillar abscess at this time.   Rapid strep negative.  Patient discharged home with Prednisone prescription to help with swelling and given Lortab for pain.  Return precautions discussed with the patient.          Pascal Lux Vardaman, PA-C 08/05/12 1331

## 2012-08-04 NOTE — ED Notes (Addendum)
Pt presents with NAD- seen and ttreated here this past weekend and discharge- Pt reports taking RX as prescribed yet feels no better.  Pt also report increased swelling and fever with ear pain

## 2012-08-05 NOTE — ED Provider Notes (Signed)
Medical screening examination/treatment/procedure(s) were performed by non-physician practitioner and as supervising physician I was immediately available for consultation/collaboration.   Loren Racer, MD 08/05/12 1505

## 2012-12-14 ENCOUNTER — Emergency Department (HOSPITAL_COMMUNITY)
Admission: EM | Admit: 2012-12-14 | Discharge: 2012-12-14 | Disposition: A | Discharge status: Home / Self Care | Payer: Self-pay | Attending: Emergency Medicine | Admitting: Emergency Medicine

## 2012-12-14 ENCOUNTER — Emergency Department (HOSPITAL_COMMUNITY): Payer: Self-pay

## 2012-12-14 ENCOUNTER — Encounter (HOSPITAL_COMMUNITY): Payer: Self-pay | Admitting: Emergency Medicine

## 2012-12-14 DIAGNOSIS — H9319 Tinnitus, unspecified ear: Secondary | ICD-10-CM | POA: Insufficient documentation

## 2012-12-14 DIAGNOSIS — I10 Essential (primary) hypertension: Secondary | ICD-10-CM | POA: Insufficient documentation

## 2012-12-14 DIAGNOSIS — M26629 Arthralgia of temporomandibular joint, unspecified side: Secondary | ICD-10-CM | POA: Insufficient documentation

## 2012-12-14 DIAGNOSIS — J019 Acute sinusitis, unspecified: Secondary | ICD-10-CM | POA: Insufficient documentation

## 2012-12-14 DIAGNOSIS — Z87440 Personal history of urinary (tract) infections: Secondary | ICD-10-CM | POA: Insufficient documentation

## 2012-12-14 DIAGNOSIS — J329 Chronic sinusitis, unspecified: Secondary | ICD-10-CM

## 2012-12-14 DIAGNOSIS — R110 Nausea: Secondary | ICD-10-CM | POA: Insufficient documentation

## 2012-12-14 DIAGNOSIS — F17200 Nicotine dependence, unspecified, uncomplicated: Secondary | ICD-10-CM | POA: Insufficient documentation

## 2012-12-14 LAB — BASIC METABOLIC PANEL
CO2: 27 mEq/L (ref 19–32)
Calcium: 9 mg/dL (ref 8.4–10.5)
Creatinine, Ser: 0.5 mg/dL (ref 0.50–1.10)
GFR calc Af Amer: >90 mL/min (ref >90)
Sodium: 136 mEq/L (ref 135–145)

## 2012-12-14 LAB — CBC WITH DIFFERENTIAL/PLATELET
Basophils Absolute: 0 10*3/uL (ref 0.0–0.1)
Basophils Relative: 1 % (ref 0–1)
Eosinophils Relative: 2 % (ref 0–5)
Lymphocytes Relative: 39 % (ref 12–46)
MCHC: 31.9 g/dL (ref 30.0–36.0)
MCV: 80.1 fL (ref 78.0–100.0)
Neutro Abs: 2.4 10*3/uL (ref 1.7–7.7)
Platelets: 338 10*3/uL (ref 150–400)
RDW: 15.7 % — ABNORMAL HIGH (ref 11.5–15.5)
WBC: 5 10*3/uL (ref 4.0–10.5)

## 2012-12-14 MED ORDER — HYDROCODONE-ACETAMINOPHEN 5-325 MG PO TABS: 1.0000 | ORAL_TABLET | Freq: Four times a day (QID) | ORAL | Status: DC | PRN

## 2012-12-14 MED ORDER — AMOXICILLIN-POT CLAVULANATE 875-125 MG PO TABS
1.0000 | ORAL_TABLET | Freq: Once | ORAL | Status: AC
  Administered 2012-12-14: 1 via ORAL
  Filled 2012-12-14: qty 1

## 2012-12-14 MED ORDER — MORPHINE SULFATE 4 MG/ML IJ SOLN
4.0000 mg | Freq: Once | INTRAMUSCULAR | Status: AC
  Administered 2012-12-14: 4 mg via INTRAVENOUS
  Filled 2012-12-14: qty 1

## 2012-12-14 MED ORDER — AMOXICILLIN-POT CLAVULANATE 875-125 MG PO TABS: 1.0000 | ORAL_TABLET | Freq: Two times a day (BID) | ORAL | Status: DC

## 2012-12-14 MED ORDER — IOHEXOL 300 MG/ML  SOLN
75.0000 mL | Freq: Once | INTRAMUSCULAR | Status: AC | PRN
  Administered 2012-12-14: 75 mL via INTRAVENOUS

## 2012-12-14 MED ORDER — ONDANSETRON HCL 4 MG/2ML IJ SOLN
4.0000 mg | Freq: Once | INTRAMUSCULAR | Status: AC
  Administered 2012-12-14: 4 mg via INTRAVENOUS
  Filled 2012-12-14: qty 2

## 2012-12-14 NOTE — ED Provider Notes (Signed)
Medical screening examination/treatment/procedure(s) were conducted as a shared visit with non-physician practitioner(s) and myself.  I personally evaluated the patient during the encounter  Tenderness right mastoid without erythema or warmth. Lesser tenderness of right TMJ.  Hanley Seamen, MD 12/14/12 [phone]

## 2012-12-14 NOTE — ED Provider Notes (Signed)
Nursing notes and vitals signs, including pulse oximetry, reviewed.  Summary of this visit's results, reviewed by myself:  Labs:  Results for orders placed during the hospital encounter of 12/14/12 (from the past 24 hour(s))  CBC WITH DIFFERENTIAL     Status: Abnormal   Collection Time    12/14/12  1:30 AM      Result Value Range   WBC 5.0  4.0 - 10.5 K/uL   RBC 4.03  3.87 - 5.11 MIL/uL   Hemoglobin 10.3 (*) 12.0 - 15.0 g/dL   HCT 16.1 (*) 09.6 - 04.5 %   MCV 80.1  78.0 - 100.0 fL   MCH 25.6 (*) 26.0 - 34.0 pg   MCHC 31.9  30.0 - 36.0 g/dL   RDW 40.9 (*) 81.1 - 91.4 %   Platelets 338  150 - 400 K/uL   Neutrophils Relative 48  43 - 77 %   Neutro Abs 2.4  1.7 - 7.7 K/uL   Lymphocytes Relative 39  12 - 46 %   Lymphs Abs 1.9  0.7 - 4.0 K/uL   Monocytes Relative 11  3 - 12 %   Monocytes Absolute 0.5  0.1 - 1.0 K/uL   Eosinophils Relative 2  0 - 5 %   Eosinophils Absolute 0.1  0.0 - 0.7 K/uL   Basophils Relative 1  0 - 1 %   Basophils Absolute 0.0  0.0 - 0.1 K/uL  BASIC METABOLIC PANEL     Status: Abnormal   Collection Time    12/14/12  1:30 AM      Result Value Range   Sodium 136  135 - 145 mEq/L   Potassium 3.3 (*) 3.5 - 5.1 mEq/L   Chloride 100  96 - 112 mEq/L   CO2 27  19 - 32 mEq/L   Glucose, Bld 106 (*) 70 - 99 mg/dL   BUN 11  6 - 23 mg/dL   Creatinine, Ser 7.82  0.50 - 1.10 mg/dL   Calcium 9.0  8.4 - 95.6 mg/dL   GFR calc non Af Amer >90  >90 mL/min   GFR calc Af Amer >90  >90 mL/min   Preliminary CT readings are consistent with sinusitis, no evidence of mastoiditis.   Hanley Seamen, MD 12/14/12 [phone]

## 2012-12-14 NOTE — ED Provider Notes (Signed)
History     CSN: [id number]  Arrival date & time 12/14/12  0026   First MD Initiated Contact with Patient 12/14/12 0030      Chief Complaint  Patient presents with  . Otalgia    (Consider location/radiation/quality/duration/timing/severity/associated sxs/prior treatment) HPI Comments: This is a 32 year old female, past medical history remarkable for hypertension, who presents emergency department with chief complaint of ear pain. Patient states that her symptoms began last Monday. She states that it is progressively worsened throughout the week. She states that her pain is 10 out of 10. She denies any fevers, hearing loss, or dizziness. She endorses mild tinnitus, and nausea. She denies chest pain, shortness of breath, vomiting, diarrhea, and constipation. She has not tried anything to alleviate her symptoms.  The history is provided by the patient. No language interpreter was used.    Past Medical History  Diagnosis Date  . Hypertension   . UTI (lower urinary tract infection)     Past Surgical History  Procedure Laterality Date  . Cholecystectomy      Family History  Problem Relation Age of Onset  . Hypertension Other     History  Substance Use Topics  . Smoking status: Current Every Day Smoker    Types: Cigarettes  . Smokeless tobacco: Not on file  . Alcohol Use: Yes     Comment: seldom    OB History   Grav Para Term Preterm Abortions TAB SAB Ect Mult Living                  Review of Systems  All other systems reviewed and are negative.    Allergies  Review of patient's allergies indicates no known allergies.  Home Medications   Current Outpatient Rx  Name  Route  Sig  Dispense  Refill  . ibuprofen (ADVIL,MOTRIN) 100 MG/5ML suspension   Oral   Take 300 mg by mouth every 6 (six) hours as needed. For pain.           BP 135/75  Pulse 94  Temp(Src) 98.7 F (37.1 C) (Oral)  Resp 20  SpO2 100%  LMP 12/05/2012  Physical Exam  Nursing note  and vitals reviewed. Constitutional: She is oriented to person, place, and time. She appears well-developed and well-nourished.  HENT:  Head: Normocephalic and atraumatic.  Right Ear: External ear normal.  Left Ear: External ear normal.  Bilateral ear canals are clean clear and without infection, bilateral tympanic membranes are clear, no signs of infection, bulging, or trauma. Patient is very tender to palpation over the right mastoid, she is mildly tender to palpation over the right TMJ, however it is not nearly as significant as over the right mastoid.  Eyes: Conjunctivae and EOM are normal. Pupils are equal, round, and reactive to light.  Neck: Normal range of motion. Neck supple.  Cardiovascular: Normal rate and regular rhythm.  Exam reveals no gallop and no friction rub.   No murmur heard. Pulmonary/Chest: Effort normal and breath sounds normal. No respiratory distress. She has no wheezes. She has no rales. She exhibits no tenderness.  Abdominal: Soft. Bowel sounds are normal. She exhibits no distension and no mass. There is no tenderness. There is no rebound and no guarding.  Musculoskeletal: Normal range of motion. She exhibits no edema and no tenderness.  Neurological: She is alert and oriented to person, place, and time.  Skin: Skin is warm and dry.  Psychiatric: She has a normal mood and affect. Her behavior is normal.  Judgment and thought content normal.    ED Course  Procedures (including critical care time)  Labs Reviewed  CBC WITH DIFFERENTIAL  BASIC METABOLIC PANEL   No results found.   No diagnosis found.    MDM  32 year old female with ear pain. Concern for mastoiditis.  Doubt TMJ, clear TMs and external ear canals. This patient has been seen by and discussed with Dr. Read Drivers, who will continue care at this time.  I have ordered basic labs, pain meds, and CT of temporal bones with contrast.       Roxy Horseman, PA-C 12/14/12 [phone]

## 2012-12-14 NOTE — ED Notes (Signed)
Pt c/o ear pain that started last monday, pain is worse tonight pt rates pain 10/10.pt has some nausea.VSS

## 2013-01-30 ENCOUNTER — Emergency Department (HOSPITAL_COMMUNITY)
Admission: EM | Admit: 2013-01-30 | Discharge: 2013-01-30 | Disposition: A | Discharge status: Home / Self Care | Payer: Self-pay | Attending: Emergency Medicine | Admitting: Emergency Medicine

## 2013-01-30 ENCOUNTER — Encounter (HOSPITAL_COMMUNITY): Payer: Self-pay | Admitting: Emergency Medicine

## 2013-01-30 DIAGNOSIS — K029 Dental caries, unspecified: Secondary | ICD-10-CM | POA: Insufficient documentation

## 2013-01-30 DIAGNOSIS — K089 Disorder of teeth and supporting structures, unspecified: Secondary | ICD-10-CM | POA: Insufficient documentation

## 2013-01-30 DIAGNOSIS — R51 Headache: Secondary | ICD-10-CM | POA: Insufficient documentation

## 2013-01-30 DIAGNOSIS — I10 Essential (primary) hypertension: Secondary | ICD-10-CM | POA: Insufficient documentation

## 2013-01-30 DIAGNOSIS — L299 Pruritus, unspecified: Secondary | ICD-10-CM | POA: Insufficient documentation

## 2013-01-30 DIAGNOSIS — F17200 Nicotine dependence, unspecified, uncomplicated: Secondary | ICD-10-CM | POA: Insufficient documentation

## 2013-01-30 DIAGNOSIS — Z87440 Personal history of urinary (tract) infections: Secondary | ICD-10-CM | POA: Insufficient documentation

## 2013-01-30 DIAGNOSIS — K0889 Other specified disorders of teeth and supporting structures: Secondary | ICD-10-CM

## 2013-01-30 DIAGNOSIS — R6884 Jaw pain: Secondary | ICD-10-CM | POA: Insufficient documentation

## 2013-01-30 MED ORDER — IBUPROFEN 800 MG PO TABS
800.0000 mg | ORAL_TABLET | Freq: Once | ORAL | Status: AC
  Administered 2013-01-30: 800 mg via ORAL
  Filled 2013-01-30: qty 1

## 2013-01-30 MED ORDER — OXYCODONE-ACETAMINOPHEN 5-325 MG PO TABS
1.0000 | ORAL_TABLET | ORAL | Status: DC | PRN
Start: 2013-01-30 — End: 2013-02-16

## 2013-01-30 MED ORDER — CLINDAMYCIN HCL 150 MG PO CAPS: 300.0000 mg | ORAL_CAPSULE | Freq: Three times a day (TID) | ORAL | Status: DC

## 2013-01-30 NOTE — ED Notes (Signed)
Pt states she had teeth pulled couple months ago and is been having lower, back jaw pain since the beginning of this week. The pain radiates from her jaw to right ear. Pt also c/o right ear ache and right cheek itching that started last night.

## 2013-01-30 NOTE — ED Provider Notes (Signed)
History     CSN: [id number]  Arrival date & time 01/30/13  1139   First MD Initiated Contact with Patient 01/30/13 1205      Chief Complaint  Patient presents with  . Otalgia    right ear  . Jaw Pain    right lower back jaw  . Pruritis    right cheek    (Consider location/radiation/quality/duration/timing/severity/associated sxs/prior treatment) HPI  Patient has a 32 year old female past medical history significant for previous dental infections and injuries presenting to ED for dental pain that began at the beginning of the week. This pain is radiating from her right jaw to her right ear pain is 10/10. Describes pain as sharp. Full liquids and eating aggravate pain. No alleviating factors. Patient has recently had multiple teeth on right-sided jaw cold by dentist for infections and damage. This has not followed up with dentist since then. Denies trauma to the mouth and teeth, purulent or bloody discharge from gums, erythema or swelling of gums, difficulty swallowing, difficulty opening mouth, changes in voice, fevers chills nausea vomiting chest pain shortness.  Past Medical History  Diagnosis Date  . Hypertension   . UTI (lower urinary tract infection)     Past Surgical History  Procedure Laterality Date  . Cholecystectomy    . Tubal ligation      Family History  Problem Relation Age of Onset  . Hypertension Other     History  Substance Use Topics  . Smoking status: Current Every Day Smoker    Types: Cigarettes  . Smokeless tobacco: Not on file  . Alcohol Use: Yes     Comment: seldom    OB History   Grav Para Term Preterm Abortions TAB SAB Ect Mult Living                  Review of Systems  Constitutional: Negative for fever and chills.  HENT: Positive for ear pain and dental problem. Negative for trouble swallowing and voice change.   Respiratory: Negative for shortness of breath.   Cardiovascular: Negative for chest pain.  Neurological: Positive for  headaches.  All other systems reviewed and are negative.    Allergies  Amoxicillin and Penicillins  Home Medications   Current Outpatient Rx  Name  Route  Sig  Dispense  Refill  . amoxicillin-clavulanate (AUGMENTIN) 875-125 MG per tablet   Oral   Take 1 tablet by mouth 2 (two) times daily. One po bid x 7 days   14 tablet   0   . clindamycin (CLEOCIN) 150 MG capsule   Oral   Take 2 capsules (300 mg total) by mouth 3 (three) times daily.   21 capsule   0   . diphenhydrAMINE (BENADRYL) 25 MG tablet   Oral   Take 25 mg by mouth every 6 (six) hours as needed for itching.         Marland Kitchen HYDROcodone-acetaminophen (NORCO) 5-325 MG per tablet   Oral   Take 1-2 tablets by mouth every 6 (six) hours as needed for pain.   20 tablet   0   . oxyCODONE-acetaminophen (ROXICET) 5-325 MG per tablet   Oral   Take 1 tablet by mouth every 4 (four) hours as needed for pain.   10 tablet   0     BP 100/80  Pulse 95  Temp(Src) 98.8 F (37.1 C) (Oral)  Resp 18  SpO2 98%  LMP 01/29/2013  Physical Exam  Constitutional: She is oriented to person, place,  and time. She appears well-developed and well-nourished. No distress.  HENT:  Head: Normocephalic and atraumatic. No trismus in the jaw.  Mouth/Throat: Uvula is midline, oropharynx is clear and moist and mucous membranes are normal. She does not have dentures. No oral lesions. Abnormal dentition. Dental caries present. No dental abscesses, edematous or lacerations. No oropharyngeal exudate, posterior oropharyngeal edema, posterior oropharyngeal erythema or tonsillar abscesses.  #32 and 30 and removed a dentist. Space a #30 the site of pain without erythema, swelling, drainage, signs of abscess.  Eyes: Conjunctivae are normal.  Neck: Normal range of motion. Neck supple. No tracheal deviation present.  Cardiovascular: Normal rate, regular rhythm and normal heart sounds.   Pulmonary/Chest: Effort normal and breath sounds normal. No respiratory  distress. She has no wheezes. She exhibits no tenderness.  Abdominal: Soft.  Lymphadenopathy:    She has no cervical adenopathy.  Neurological: She is alert and oriented to person, place, and time.  Skin: Skin is warm and dry. She is not diaphoretic.    ED Course  Procedures (including critical care time)  Labs Reviewed - No data to display No results found.   1. Pain, dental       MDM  Patient with toothache.  No gross abscess.  Exam unconcerning for Ludwig's angina or spread of infection.  Will treat with clindamycin due to penicillin allergy and pain medicine.  Advised patient to followup with dentist in the next 48 hours for further evaluation. Return precautions given. Patient agreeable to plan. Stable at time of discharge.        Jeannetta Ellis, PA-C 01/30/13 1447

## 2013-01-31 NOTE — ED Provider Notes (Signed)
Medical screening examination/treatment/procedure(s) were performed by non-physician practitioner and as supervising physician I was immediately available for consultation/collaboration.  Toy Baker, MD 01/31/13 [phone]

## 2013-02-15 DIAGNOSIS — F17200 Nicotine dependence, unspecified, uncomplicated: Secondary | ICD-10-CM | POA: Insufficient documentation

## 2013-02-15 DIAGNOSIS — R110 Nausea: Secondary | ICD-10-CM | POA: Insufficient documentation

## 2013-02-15 DIAGNOSIS — G43909 Migraine, unspecified, not intractable, without status migrainosus: Secondary | ICD-10-CM | POA: Insufficient documentation

## 2013-02-15 DIAGNOSIS — I10 Essential (primary) hypertension: Secondary | ICD-10-CM | POA: Insufficient documentation

## 2013-02-15 DIAGNOSIS — Z87440 Personal history of urinary (tract) infections: Secondary | ICD-10-CM | POA: Insufficient documentation

## 2013-02-15 DIAGNOSIS — H53149 Visual discomfort, unspecified: Secondary | ICD-10-CM | POA: Insufficient documentation

## 2013-02-16 ENCOUNTER — Encounter (HOSPITAL_COMMUNITY): Payer: Self-pay | Admitting: *Deleted

## 2013-02-16 ENCOUNTER — Emergency Department (HOSPITAL_COMMUNITY)
Admission: EM | Admit: 2013-02-16 | Discharge: 2013-02-16 | Disposition: A | Discharge status: Home / Self Care | Payer: Self-pay | Attending: Emergency Medicine | Admitting: Emergency Medicine

## 2013-02-16 DIAGNOSIS — G43909 Migraine, unspecified, not intractable, without status migrainosus: Secondary | ICD-10-CM

## 2013-02-16 MED ORDER — KETOROLAC TROMETHAMINE 30 MG/ML IJ SOLN
15.0000 mg | Freq: Once | INTRAMUSCULAR | Status: AC
  Administered 2013-02-16: 15 mg via INTRAVENOUS
  Filled 2013-02-16: qty 1

## 2013-02-16 MED ORDER — DIPHENHYDRAMINE HCL 50 MG/ML IJ SOLN
25.0000 mg | Freq: Once | INTRAMUSCULAR | Status: AC
  Administered 2013-02-16: 25 mg via INTRAVENOUS
  Filled 2013-02-16: qty 1

## 2013-02-16 MED ORDER — METOCLOPRAMIDE HCL 5 MG/ML IJ SOLN
10.0000 mg | Freq: Once | INTRAMUSCULAR | Status: AC
  Administered 2013-02-16: 10 mg via INTRAVENOUS
  Filled 2013-02-16: qty 2

## 2013-02-16 MED ORDER — SODIUM CHLORIDE 0.9 % IV BOLUS (SEPSIS)
1000.0000 mL | Freq: Once | INTRAVENOUS | Status: AC
  Administered 2013-02-16: 1000 mL via INTRAVENOUS

## 2013-02-16 NOTE — ED Notes (Signed)
Pt c/o headache and back pain that began on Sunday; pt states she did not suffer any injury but is complaining of lower back pain and headache.

## 2013-02-16 NOTE — ED Provider Notes (Addendum)
History     CSN: [id number]  Arrival date & time 02/15/13  2331   First MD Initiated Contact with Patient 02/16/13 0534      Chief Complaint  Patient presents with  . Back Pain and Headache     (Consider location/radiation/quality/duration/timing/severity/associated sxs/prior treatment) HPI This is a 32 year old female with a history of headaches dating back to her teenage years. She's never been formally diagnosed with migraines. She is here with a headache that began 2 days ago. The onset was gradual. It was severe at its worst although it is improved somewhat after taking over-the-counter headache powder. It was associated with nausea but that has resolved. It is also associated with photophobia which persists. The headache is located in the bilateral temples and forehead. There is also some pain at the base of the skull. The pain is characterized as throbbing unlike prior headaches. She states is about a 7/10 at this time.  Past Medical History  Diagnosis Date  . Hypertension   . UTI (lower urinary tract infection)     Past Surgical History  Procedure Laterality Date  . Cholecystectomy    . Tubal ligation      Family History  Problem Relation Age of Onset  . Hypertension Other     History  Substance Use Topics  . Smoking status: Current Every Day Smoker -- 0.50 packs/day    Types: Cigarettes  . Smokeless tobacco: Not on file  . Alcohol Use: Yes     Comment: seldom    OB History   Grav Para Term Preterm Abortions TAB SAB Ect Mult Living                  Review of Systems  All other systems reviewed and are negative.    Allergies  Amoxicillin and Penicillins  Home Medications   Current Outpatient Rx  Name  Route  Sig  Dispense  Refill  . Aspirin-Salicylamide-Caffeine (BC HEADACHE POWDER PO)   Oral   Take 1 packet by mouth as needed (Pain).           BP 122/84  Pulse 76  Temp(Src) 98.9 F (37.2 C) (Oral)  Resp 18  SpO2 100%  LMP  01/29/2013  Physical Exam General: Well-developed, well-nourished female in no acute distress; appearance consistent with age of record HENT: normocephalic, atraumatic Eyes: pupils equal round and reactive to light; extraocular muscles intact; photophobia Neck: supple Heart: regular rate and rhythm Lungs: clear to auscultation bilaterally Abdomen: soft; nondistended; nontender; no masses or hepatosplenomegaly; bowel sounds present Extremities: No deformity; full range of motion Neurologic: Sleepy but easily awakened; motor function intact in all extremities and symmetric; no facial droop Skin: Warm and dry Psychiatric: Flat affect    ED Course  Procedures (including critical care time)     MDM  7:42 AM Feels better after IV fluids and medications. Patient's pattern and symptoms suggests migraines that have never been formally diagnosed.     Hanley Seamen, MD 02/16/13 [id number]  Hanley Seamen, MD 02/16/13 [phone]

## 2013-08-07 ENCOUNTER — Emergency Department (HOSPITAL_COMMUNITY)
Admission: EM | Admit: 2013-08-07 | Discharge: 2013-08-07 | Disposition: A | Discharge status: Home / Self Care | Payer: Medicaid Other | Attending: Emergency Medicine | Admitting: Emergency Medicine

## 2013-08-07 ENCOUNTER — Emergency Department (HOSPITAL_COMMUNITY): Payer: Medicaid Other

## 2013-08-07 ENCOUNTER — Encounter (HOSPITAL_COMMUNITY): Payer: Self-pay | Admitting: Emergency Medicine

## 2013-08-07 DIAGNOSIS — R319 Hematuria, unspecified: Secondary | ICD-10-CM | POA: Insufficient documentation

## 2013-08-07 DIAGNOSIS — F17200 Nicotine dependence, unspecified, uncomplicated: Secondary | ICD-10-CM | POA: Insufficient documentation

## 2013-08-07 DIAGNOSIS — G8929 Other chronic pain: Secondary | ICD-10-CM | POA: Insufficient documentation

## 2013-08-07 DIAGNOSIS — R935 Abnormal findings on diagnostic imaging of other abdominal regions, including retroperitoneum: Secondary | ICD-10-CM | POA: Insufficient documentation

## 2013-08-07 DIAGNOSIS — R300 Dysuria: Secondary | ICD-10-CM | POA: Insufficient documentation

## 2013-08-07 DIAGNOSIS — I10 Essential (primary) hypertension: Secondary | ICD-10-CM | POA: Insufficient documentation

## 2013-08-07 DIAGNOSIS — N390 Urinary tract infection, site not specified: Secondary | ICD-10-CM | POA: Insufficient documentation

## 2013-08-07 DIAGNOSIS — M549 Dorsalgia, unspecified: Secondary | ICD-10-CM | POA: Insufficient documentation

## 2013-08-07 DIAGNOSIS — N9489 Other specified conditions associated with female genital organs and menstrual cycle: Secondary | ICD-10-CM

## 2013-08-07 DIAGNOSIS — A599 Trichomoniasis, unspecified: Secondary | ICD-10-CM

## 2013-08-07 DIAGNOSIS — Z3202 Encounter for pregnancy test, result negative: Secondary | ICD-10-CM | POA: Insufficient documentation

## 2013-08-07 DIAGNOSIS — A5900 Urogenital trichomoniasis, unspecified: Secondary | ICD-10-CM | POA: Insufficient documentation

## 2013-08-07 LAB — URINE MICROSCOPIC-ADD ON

## 2013-08-07 LAB — URINALYSIS, ROUTINE W REFLEX MICROSCOPIC
Glucose, UA: NEGATIVE mg/dL
Ketones, ur: NEGATIVE mg/dL
Nitrite: NEGATIVE
Protein, ur: NEGATIVE mg/dL
Urobilinogen, UA: 1 mg/dL (ref 0.0–1.0)

## 2013-08-07 LAB — POCT I-STAT, CHEM 8
BUN: 7 mg/dL (ref 6–23)
Calcium, Ion: 1.23 mmol/L (ref 1.12–1.23)
Chloride: 105 mEq/L (ref 96–112)
HCT: 35 % — ABNORMAL LOW (ref 36.0–46.0)
Hemoglobin: 11.9 g/dL — ABNORMAL LOW (ref 12.0–15.0)
Sodium: 140 mEq/L (ref 135–145)

## 2013-08-07 LAB — POCT PREGNANCY, URINE
Preg Test, Ur: NEGATIVE
Preg Test, Ur: NEGATIVE

## 2013-08-07 MED ORDER — OXYCODONE-ACETAMINOPHEN 5-325 MG PO TABS
2.0000 | ORAL_TABLET | Freq: Once | ORAL | Status: AC
  Administered 2013-08-07: 2 via ORAL
  Filled 2013-08-07: qty 2

## 2013-08-07 MED ORDER — CIPROFLOXACIN HCL 500 MG PO TABS: 500.0000 mg | ORAL_TABLET | Freq: Two times a day (BID) | ORAL | Status: DC

## 2013-08-07 MED ORDER — METRONIDAZOLE 500 MG PO TABS: 500.0000 mg | ORAL_TABLET | Freq: Two times a day (BID) | ORAL | Status: DC

## 2013-08-07 MED ORDER — TRAMADOL HCL 50 MG PO TABS: 50.0000 mg | ORAL_TABLET | Freq: Four times a day (QID) | ORAL | Status: DC | PRN

## 2013-08-07 NOTE — ED Provider Notes (Signed)
CSN: [id number]     Arrival date & time 08/07/13  1542 History   First MD Initiated Contact with Patient 08/07/13 1603     Chief Complaint  Patient presents with  . Abdominal Pain  . Back Pain  . Dysuria   (Consider location/radiation/quality/duration/timing/severity/associated sxs/prior Treatment) HPI Comments: Patient presents with low back pain. She states she has chronic low back pain due to his spina bifida the last 3 weeks she's had marked aching feeling across the musculature of her lower back. At times she has a sharp pain it radiates to her lower abdomen. She also has some achy pain to her suprapubic area. About 4-5 days ago she started noticing burning when she urinates and some pressure in her abdomen when she urinates. She denies any nausea or vomiting. She denies he fevers or chills. she currently denies need for any pain medication. She's taking ibuprofen at home with some relief. She denies any vaginal bleeding or discharge.  Patient is a 32 y.o. female presenting with abdominal pain, back pain, and dysuria.  Abdominal Pain Associated symptoms: dysuria   Associated symptoms: no chest pain, no chills, no cough, no diarrhea, no fatigue, no fever, no hematuria, no nausea, no shortness of breath, no vaginal bleeding, no vaginal discharge and no vomiting   Back Pain Associated symptoms: abdominal pain and dysuria   Associated symptoms: no chest pain, no fever, no headaches, no numbness and no weakness   Dysuria Associated symptoms: abdominal pain   Associated symptoms: no fever, no flank pain, no nausea, no vaginal discharge and no vomiting     Past Medical History  Diagnosis Date  . Hypertension   . UTI (lower urinary tract infection)    Past Surgical History  Procedure Laterality Date  . Cholecystectomy    . Tubal ligation     Family History  Problem Relation Age of Onset  . Hypertension Other    History  Substance Use Topics  . Smoking status: Current Every Day  Smoker -- 0.50 packs/day    Types: Cigarettes  . Smokeless tobacco: Not on file  . Alcohol Use: Yes     Comment: seldom   OB History   Grav Para Term Preterm Abortions TAB SAB Ect Mult Living                 Review of Systems  Constitutional: Negative for fever, chills, diaphoresis and fatigue.  HENT: Negative for congestion, rhinorrhea and sneezing.   Eyes: Negative.   Respiratory: Negative for cough, chest tightness and shortness of breath.   Cardiovascular: Negative for chest pain and leg swelling.  Gastrointestinal: Positive for abdominal pain. Negative for nausea, vomiting, diarrhea and blood in stool.  Genitourinary: Positive for dysuria. Negative for frequency, hematuria, flank pain, vaginal bleeding, vaginal discharge and difficulty urinating.  Musculoskeletal: Positive for back pain. Negative for arthralgias.  Skin: Negative for rash.  Neurological: Negative for dizziness, speech difficulty, weakness, numbness and headaches.    Allergies  Review of patient's allergies indicates no active allergies.  Home Medications   Current Outpatient Rx  Name  Route  Sig  Dispense  Refill  . ibuprofen (ADVIL,MOTRIN) 200 MG tablet   Oral   Take 400 mg by mouth every 6 (six) hours as needed for pain.         . ciprofloxacin (CIPRO) 500 MG tablet   Oral   Take 1 tablet (500 mg total) by mouth 2 (two) times daily. One po bid x 7 days  14 tablet   0   . metroNIDAZOLE (FLAGYL) 500 MG tablet   Oral   Take 1 tablet (500 mg total) by mouth 2 (two) times daily. One po bid x 7 days   14 tablet   0   . traMADol (ULTRAM) 50 MG tablet   Oral   Take 1 tablet (50 mg total) by mouth every 6 (six) hours as needed for pain.   15 tablet   0    BP 130/89  Pulse 86  Temp(Src) 98.3 F (36.8 C) (Oral)  SpO2 100%  LMP 07/07/2013 Physical Exam  Constitutional: She is oriented to person, place, and time. She appears well-developed and well-nourished.  HENT:  Head: Normocephalic and  atraumatic.  Eyes: Pupils are equal, round, and reactive to light.  Neck: Normal range of motion. Neck supple.  Cardiovascular: Normal rate, regular rhythm and normal heart sounds.   Pulmonary/Chest: Effort normal and breath sounds normal. No respiratory distress. She has no wheezes. She has no rales. She exhibits no tenderness.  Abdominal: Soft. Bowel sounds are normal. There is no tenderness. There is no rebound and no guarding.  Musculoskeletal: Normal range of motion. She exhibits no edema.  Mild tenderness across the lumbar musculature bilaterally. No spinal tenderness. No CVA tenderness.  Lymphadenopathy:    She has no cervical adenopathy.  Neurological: She is alert and oriented to person, place, and time.  Skin: Skin is warm and dry. No rash noted.  Psychiatric: She has a normal mood and affect.    ED Course  Procedures (including critical care time) Labs Review Labs Reviewed  URINALYSIS, ROUTINE W REFLEX MICROSCOPIC - Abnormal; Notable for the following:    Hgb urine dipstick MODERATE (*)    Leukocytes, UA MODERATE (*)    All other components within normal limits  URINE MICROSCOPIC-ADD ON - Abnormal; Notable for the following:    Bacteria, UA FEW (*)    All other components within normal limits  POCT I-STAT, CHEM 8 - Abnormal; Notable for the following:    Potassium 3.3 (*)    Glucose, Bld 104 (*)    Hemoglobin 11.9 (*)    HCT 35.0 (*)    All other components within normal limits  URINE CULTURE  POCT PREGNANCY, URINE  POCT PREGNANCY, URINE   Imaging Review Ct Abdomen Pelvis Wo Contrast  08/07/2013   CLINICAL DATA:  Right flank pain  EXAM: CT ABDOMEN AND PELVIS WITHOUT  TECHNIQUE: Multidetector CT imaging of the chest, abdomen and pelvis was performed following the standard protocol without IV contrast.  COMPARISON:  10/30/2010  FINDINGS: No hydronephrosis. Tiny calculi in the collecting system of both kidneys. No ureteral calculus.  Bladder, uterus, and left adnexa are  within normal limits. Large 5.7 cm bilobed cystic structure in the right adnexa.  Small umbilical hernia containing adipose tissue  Unenhanced liver, spleen, pancreas, adrenal glands are within normal limits  Post cholecystectomy  Normal appendix.  IMPRESSION: Bilateral nephrolithiasis. No ureteral calculus  Large 5.7 cm bilobed cystic lesion in the right adnexum. Ultrasound is recommended to further characterize.   Electronically Signed   By: Maryclare Bean M.D.   On: 08/07/2013 17:59    EKG Interpretation   None       MDM   1. UTI (lower urinary tract infection)   2. Trichomonas   3. Adnexal mass    Patient has evidence of UTI on urinalysis as well as hematuria. CT scan did not demonstrate any ureteral stones. She is  well-appearing with no vomiting or systemic signs of illness. She was started on Cipro for the UTI and was also started on Flagyl for her Trichomonas. I encouraged her to followup with the gynecologist or the [facility] outpatient center for an ultrasound to further evaluate the cystic lesion in her right adnexa. I also advised her to follow with the health department for further STD testing.    Rolan Bucco, MD 08/07/13 [phone]

## 2013-08-07 NOTE — ED Notes (Signed)
Abdominal pain that radiates into back, trouble and painful urination onset 08/04/2013 chronic back pain as well

## 2013-08-07 NOTE — ED Notes (Signed)
She c/o low back pain radiating into right flank, x ~2 weeks.  She states she has had a few diarrhea stools this same interval.  She is alert and oriented x 4 and in no distress.

## 2013-08-07 NOTE — ED Notes (Addendum)
Pt states that she has been having low back pain x 3-4 wks.  Now radiating to low abd since Wednesday.  At that same time, started experiencing dysuria. Also c/o diarrhea x 1 wk.

## 2013-08-08 LAB — URINE CULTURE

## 2014-02-23 ENCOUNTER — Encounter (HOSPITAL_COMMUNITY): Payer: Self-pay | Admitting: Emergency Medicine

## 2014-02-23 ENCOUNTER — Emergency Department (HOSPITAL_COMMUNITY)
Admission: EM | Admit: 2014-02-23 | Discharge: 2014-02-23 | Disposition: A | Discharge status: Home / Self Care | Payer: Medicaid Other | Attending: Emergency Medicine | Admitting: Emergency Medicine

## 2014-02-23 DIAGNOSIS — I10 Essential (primary) hypertension: Secondary | ICD-10-CM | POA: Insufficient documentation

## 2014-02-23 DIAGNOSIS — Z87440 Personal history of urinary (tract) infections: Secondary | ICD-10-CM | POA: Insufficient documentation

## 2014-02-23 DIAGNOSIS — Z792 Long term (current) use of antibiotics: Secondary | ICD-10-CM | POA: Insufficient documentation

## 2014-02-23 DIAGNOSIS — L84 Corns and callosities: Secondary | ICD-10-CM | POA: Insufficient documentation

## 2014-02-23 DIAGNOSIS — Z880 Allergy status to penicillin: Secondary | ICD-10-CM | POA: Insufficient documentation

## 2014-02-23 DIAGNOSIS — F17200 Nicotine dependence, unspecified, uncomplicated: Secondary | ICD-10-CM | POA: Insufficient documentation

## 2014-02-23 NOTE — ED Provider Notes (Signed)
CSN: [id number]     Arrival date & time 02/23/14  0025 History   First MD Initiated Contact with Patient 02/23/14 0102     Chief Complaint  Patient presents with  . Toe Pain     (Consider location/radiation/quality/duration/timing/severity/associated sxs/prior Treatment) HPI Comments: Pain between 4-5 th toes R foot for over a year denies injury or any treatment   Patient is a 33 y.o. female presenting with toe pain. The history is provided by the patient.  Toe Pain This is a chronic problem. The current episode started more than 1 year ago. The problem occurs intermittently. The problem has been gradually worsening. Pertinent negatives include no fever, numbness, rash or weakness. The symptoms are aggravated by walking. She has tried nothing for the symptoms. The treatment provided no relief.    Past Medical History  Diagnosis Date  . Hypertension   . UTI (lower urinary tract infection)    Past Surgical History  Procedure Laterality Date  . Cholecystectomy    . Tubal ligation     Family History  Problem Relation Age of Onset  . Hypertension Other   . Asthma Other   . Diabetes Other    History  Substance Use Topics  . Smoking status: Current Every Day Smoker -- 0.50 packs/day    Types: Cigars  . Smokeless tobacco: Not on file  . Alcohol Use: No   OB History   Grav Para Term Preterm Abortions TAB SAB Ect Mult Living                 Review of Systems  Constitutional: Negative for fever.  Skin: Negative for rash and wound.  Neurological: Negative for weakness and numbness.  All other systems reviewed and are negative.     Allergies  Penicillins  Home Medications   Prior to Admission medications   Medication Sig Start Date End Date Taking? Authorizing Provider  ciprofloxacin (CIPRO) 500 MG tablet Take 1 tablet (500 mg total) by mouth 2 (two) times daily. One po bid x 7 days 08/07/13   Malvin Johns, MD  ibuprofen (ADVIL,MOTRIN) 200 MG tablet Take 400 mg by  mouth every 6 (six) hours as needed for pain.    Historical Provider, MD  metroNIDAZOLE (FLAGYL) 500 MG tablet Take 1 tablet (500 mg total) by mouth 2 (two) times daily. One po bid x 7 days 08/07/13   Malvin Johns, MD  traMADol (ULTRAM) 50 MG tablet Take 1 tablet (50 mg total) by mouth every 6 (six) hours as needed for pain. 08/07/13   Malvin Johns, MD   BP 119/79  Pulse 94  Temp(Src) 98.2 F (36.8 C) (Oral)  Resp 18  Ht 5\' 4"  (1.626 m)  Wt 156 lb (70.761 kg)  BMI 26.76 kg/m2  SpO2 100%  LMP 02/01/2014 Physical Exam  Vitals reviewed. Constitutional: She is oriented to person, place, and time. She appears well-developed and well-nourished.  HENT:  Head: Normocephalic.  Eyes: Pupils are equal, round, and reactive to light.  Neck: Normal range of motion.  Cardiovascular: Normal rate.   Pulmonary/Chest: Effort normal.  Abdominal: Soft.  Musculoskeletal: Normal range of motion.  Neurological: She is alert and oriented to person, place, and time.  Skin: Skin is warm and dry. No erythema.  Corn on lateral aspect R 4th toe    ED Course  Procedures (including critical care time) Labs Review Labs Reviewed - No data to display  Imaging Review No results found.   EKG Interpretation None  MDM  Recommend corn pad and Podiatry FU Final diagnoses:  Corn of toe        Garald Balding, NP 02/23/14 0111  Garald Balding, NP 02/23/14 [id number]

## 2014-02-23 NOTE — Discharge Instructions (Signed)
Corns and Calluses Corns are small areas of thickened skin that usually occur on the top, sides, or tip of a toe. They contain a cone-shaped core with a point that can press on a nerve below. This causes pain. Calluses are areas of thickened skin that usually develop on hands, fingers, palms, soles of the feet, and heels. These are areas that experience frequent friction or pressure. CAUSES  Corns are usually the result of rubbing (friction) or pressure from shoes that are too tight or do not fit properly. Calluses are caused by repeated friction and pressure on the affected areas. SYMPTOMS  A hard growth on the skin.  Pain or tenderness under the skin.  Sometimes, redness and swelling.  Increased discomfort while wearing tight-fitting shoes. DIAGNOSIS  Your caregiver can usually tell what the problem is by doing a physical exam. TREATMENT  Removing the cause of the friction or pressure is usually the only treatment needed. However, sometimes medicines can be used to help soften the hardened, thickened areas. These medicines include salicylic acid plasters and 12% ammonium lactate lotion. These medicines should only be used under the direction of your caregiver. HOME CARE INSTRUCTIONS   Try to remove pressure from the affected area.  You may wear donut-shaped corn pads to protect your skin.  You may use a pumice stone or nonmetallic nail file to gently reduce the thickness of a corn.  Wear properly fitted footwear.  If you have calluses on the hands, wear gloves during activities that cause friction.  If you have diabetes, you should regularly examine your feet. Tell your caregiver if you notice any problems with your feet. SEEK IMMEDIATE MEDICAL CARE IF:   You have increased pain, swelling, redness, or warmth in the affected area.  Your corn or callus starts to drain fluid or bleeds.  You are not getting better, even with treatment. Document Released: 07/13/2004 Document  Revised: 12/30/2011 Document Reviewed: 06/04/2011 [facility] Patient Information 2014 [place], [place]. You can use a corn pad purchased at the phyarmacy to pad the area until you can be seen by the Podiatrist for definitive care

## 2014-02-23 NOTE — ED Notes (Signed)
Pt reports R 4th toe pain intermittently for a long time.

## 2014-02-23 NOTE — ED Provider Notes (Signed)
Medical screening examination/treatment/procedure(s) were performed by non-physician practitioner and as supervising physician I was immediately available for consultation/collaboration.   EKG Interpretation None       Kalman Drape, MD 02/23/14 [phone]

## 2014-02-23 NOTE — ED Notes (Signed)
Pt states she is having pain in her 4th toe on her right foot  Pt states denies injury  Pt states it has been hurting off and on for a long time

## 2014-03-01 ENCOUNTER — Ambulatory Visit: Payer: Self-pay | Admitting: Podiatry

## 2014-03-15 ENCOUNTER — Ambulatory Visit: Payer: Self-pay | Admitting: Podiatry

## 2014-05-04 ENCOUNTER — Emergency Department (HOSPITAL_COMMUNITY)
Admission: EM | Admit: 2014-05-04 | Discharge: 2014-05-05 | Disposition: A | Discharge status: Home / Self Care | Payer: Medicaid Other | Attending: Emergency Medicine | Admitting: Emergency Medicine

## 2014-05-04 ENCOUNTER — Encounter (HOSPITAL_COMMUNITY): Payer: Self-pay | Admitting: Emergency Medicine

## 2014-05-04 DIAGNOSIS — F17200 Nicotine dependence, unspecified, uncomplicated: Secondary | ICD-10-CM | POA: Insufficient documentation

## 2014-05-04 DIAGNOSIS — R319 Hematuria, unspecified: Secondary | ICD-10-CM | POA: Insufficient documentation

## 2014-05-04 DIAGNOSIS — R109 Unspecified abdominal pain: Secondary | ICD-10-CM | POA: Insufficient documentation

## 2014-05-04 DIAGNOSIS — R809 Proteinuria, unspecified: Secondary | ICD-10-CM | POA: Insufficient documentation

## 2014-05-04 DIAGNOSIS — Z3202 Encounter for pregnancy test, result negative: Secondary | ICD-10-CM | POA: Insufficient documentation

## 2014-05-04 DIAGNOSIS — Z880 Allergy status to penicillin: Secondary | ICD-10-CM | POA: Insufficient documentation

## 2014-05-04 DIAGNOSIS — I10 Essential (primary) hypertension: Secondary | ICD-10-CM | POA: Insufficient documentation

## 2014-05-04 DIAGNOSIS — N390 Urinary tract infection, site not specified: Secondary | ICD-10-CM

## 2014-05-04 LAB — PREGNANCY, URINE: Preg Test, Ur: NEGATIVE

## 2014-05-04 NOTE — ED Notes (Signed)
Pt states she has been having blood in her urine and right flank  Pt states it started today

## 2014-05-05 LAB — URINALYSIS, ROUTINE W REFLEX MICROSCOPIC
GLUCOSE, UA: NEGATIVE mg/dL
KETONES UR: 15 mg/dL — AB
Nitrite: POSITIVE — AB
PH: 5.5 (ref 5.0–8.0)
Protein, ur: 100 mg/dL — AB
SPECIFIC GRAVITY, URINE: 1.029 (ref 1.005–1.030)
Urobilinogen, UA: 1 mg/dL (ref 0.0–1.0)

## 2014-05-05 LAB — URINE MICROSCOPIC-ADD ON

## 2014-05-05 MED ORDER — SULFAMETHOXAZOLE-TRIMETHOPRIM 800-160 MG PO TABS: 1.0000 | ORAL_TABLET | Freq: Two times a day (BID) | ORAL | Status: DC

## 2014-05-05 MED ORDER — KETOROLAC TROMETHAMINE 60 MG/2ML IM SOLN
60.0000 mg | Freq: Once | INTRAMUSCULAR | Status: AC
Start: 2014-05-05 — End: 2014-05-05
  Administered 2014-05-05: 60 mg via INTRAMUSCULAR
  Filled 2014-05-05: qty 2

## 2014-05-05 MED ORDER — SULFAMETHOXAZOLE-TMP DS 800-160 MG PO TABS
1.0000 | ORAL_TABLET | Freq: Once | ORAL | Status: AC
  Administered 2014-05-05: 1 via ORAL
  Filled 2014-05-05: qty 1

## 2014-05-05 NOTE — ED Provider Notes (Signed)
CSN: [id number]     Arrival date & time 05/04/14  2249 History   First MD Initiated Contact with Patient 05/05/14 0041     Chief Complaint  Patient presents with  . Hematuria     (Consider location/radiation/quality/duration/timing/severity/associated sxs/prior Treatment) HPI Comments: 33 year old female, presents with one day of lower abdominal discomfort with pressure with urination and dark colored urine. Nothing makes this better or worse, sensation is intermittent, no associated fevers chills nausea or vomiting, history of occasional urine infections.  Patient is a 33 y.o. female presenting with hematuria. The history is provided by the patient.  Hematuria Associated symptoms include abdominal pain.    Past Medical History  Diagnosis Date  . Hypertension   . UTI (lower urinary tract infection)    Past Surgical History  Procedure Laterality Date  . Cholecystectomy    . Tubal ligation     Family History  Problem Relation Age of Onset  . Hypertension Other   . Asthma Other   . Diabetes Other    History  Substance Use Topics  . Smoking status: Current Every Day Smoker -- 0.50 packs/day    Types: Cigars, Cigarettes  . Smokeless tobacco: Not on file  . Alcohol Use: No   OB History   Grav Para Term Preterm Abortions TAB SAB Ect Mult Living                 Review of Systems  Constitutional: Negative for fever and chills.  Gastrointestinal: Positive for abdominal pain. Negative for vomiting.  Genitourinary: Positive for hematuria.      Allergies  Penicillins  Home Medications   Prior to Admission medications   Medication Sig Start Date End Date Taking? Authorizing Provider  Aspirin-Salicylamide-Caffeine (BC HEADACHE POWDER PO) Take 1 packet by mouth as needed (for pain.).   Yes Historical Provider, MD  sulfamethoxazole-trimethoprim (SEPTRA DS) 800-160 MG per tablet Take 1 tablet by mouth every 12 (twelve) hours. 05/05/14   Johnna Acosta, MD   BP 132/90   Pulse 80  Temp(Src) 97.8 F (36.6 C) (Oral)  Resp 16  SpO2 98%  LMP 04/20/2014 Physical Exam  Nursing note and vitals reviewed. Constitutional: She appears well-developed and well-nourished. No distress.  HENT:  Head: Normocephalic and atraumatic.  Eyes: Conjunctivae and EOM are normal. Pupils are equal, round, and reactive to light. Right eye exhibits no discharge. Left eye exhibits no discharge. No scleral icterus.  Neck: Normal range of motion. Neck supple. No JVD present. No thyromegaly present.  Cardiovascular: Normal rate, regular rhythm, normal heart sounds and intact distal pulses.  Exam reveals no gallop and no friction rub.   No murmur heard. Pulmonary/Chest: Effort normal and breath sounds normal. No respiratory distress. She has no wheezes. She has no rales.  Abdominal: Soft. Bowel sounds are normal. She exhibits no distension and no mass. There is no tenderness.  Lymphadenopathy:    She has no cervical adenopathy.  Neurological: She is alert.  Skin: Skin is warm and dry. No rash noted. No erythema.  Psychiatric: She has a normal mood and affect. Her behavior is normal.    ED Course  Procedures (including critical care time) Labs Review Labs Reviewed  URINALYSIS, ROUTINE W REFLEX MICROSCOPIC - Abnormal; Notable for the following:    Color, Urine RED (*)    APPearance TURBID (*)    Hgb urine dipstick LARGE (*)    Bilirubin Urine MODERATE (*)    Ketones, ur 15 (*)    Protein, ur  100 (*)    Nitrite POSITIVE (*)    Leukocytes, UA MODERATE (*)    All other components within normal limits  URINE MICROSCOPIC-ADD ON - Abnormal; Notable for the following:    Squamous Epithelial / LPF FEW (*)    Bacteria, UA MANY (*)    All other components within normal limits  URINE CULTURE  PREGNANCY, URINE    Imaging Review No results found.   MDM   Final diagnoses:  UTI (lower urinary tract infection)  Hematuria  Proteinuria    On exam the patient has minimal  suprapubic discomfort, urinalysis reveals positive nitrate, moderate leukocytes and many bacteria consistent with a urine infection, there is hematuria present however this does not offset the fact that this is likely urine infection. No signs of kidney stone, clinical picture does not support that. She will be given antibiotics, penicillin allergy, avoid Keflex initially, urine culture sent.  Meds given in ED:  Medications  sulfamethoxazole-trimethoprim (BACTRIM DS) 800-160 MG per tablet 1 tablet (not administered)    New Prescriptions   SULFAMETHOXAZOLE-TRIMETHOPRIM (SEPTRA DS) 800-160 MG PER TABLET    Take 1 tablet by mouth every 12 (twelve) hours.       Johnna Acosta, MD 05/05/14 [phone]

## 2014-05-06 LAB — URINE CULTURE
Colony Count: 25000
Special Requests: NORMAL

## 2014-05-19 ENCOUNTER — Emergency Department (HOSPITAL_COMMUNITY)
Admission: EM | Admit: 2014-05-19 | Discharge: 2014-05-19 | Disposition: A | Discharge status: Home / Self Care | Payer: Medicaid Other | Attending: Emergency Medicine | Admitting: Emergency Medicine

## 2014-05-19 ENCOUNTER — Encounter (HOSPITAL_COMMUNITY): Payer: Self-pay | Admitting: Emergency Medicine

## 2014-05-19 ENCOUNTER — Emergency Department (HOSPITAL_COMMUNITY): Payer: Medicaid Other

## 2014-05-19 DIAGNOSIS — M549 Dorsalgia, unspecified: Secondary | ICD-10-CM | POA: Insufficient documentation

## 2014-05-19 DIAGNOSIS — R110 Nausea: Secondary | ICD-10-CM | POA: Insufficient documentation

## 2014-05-19 DIAGNOSIS — F17200 Nicotine dependence, unspecified, uncomplicated: Secondary | ICD-10-CM | POA: Insufficient documentation

## 2014-05-19 DIAGNOSIS — N200 Calculus of kidney: Secondary | ICD-10-CM

## 2014-05-19 DIAGNOSIS — Z880 Allergy status to penicillin: Secondary | ICD-10-CM | POA: Insufficient documentation

## 2014-05-19 DIAGNOSIS — Z9851 Tubal ligation status: Secondary | ICD-10-CM | POA: Insufficient documentation

## 2014-05-19 DIAGNOSIS — Z3202 Encounter for pregnancy test, result negative: Secondary | ICD-10-CM | POA: Insufficient documentation

## 2014-05-19 DIAGNOSIS — I10 Essential (primary) hypertension: Secondary | ICD-10-CM | POA: Insufficient documentation

## 2014-05-19 DIAGNOSIS — Z87440 Personal history of urinary (tract) infections: Secondary | ICD-10-CM | POA: Insufficient documentation

## 2014-05-19 DIAGNOSIS — Z9089 Acquired absence of other organs: Secondary | ICD-10-CM | POA: Insufficient documentation

## 2014-05-19 DIAGNOSIS — R1031 Right lower quadrant pain: Secondary | ICD-10-CM | POA: Insufficient documentation

## 2014-05-19 LAB — CBC WITH DIFFERENTIAL/PLATELET
BASOS ABS: 0 10*3/uL (ref 0.0–0.1)
BASOS PCT: 0 % (ref 0–1)
EOS PCT: 1 % (ref 0–5)
Eosinophils Absolute: 0.1 10*3/uL (ref 0.0–0.7)
HCT: 30.7 % — ABNORMAL LOW (ref 36.0–46.0)
Hemoglobin: 9.5 g/dL — ABNORMAL LOW (ref 12.0–15.0)
LYMPHS PCT: 34 % (ref 12–46)
Lymphs Abs: 2.1 10*3/uL (ref 0.7–4.0)
MCH: 23.3 pg — ABNORMAL LOW (ref 26.0–34.0)
MCHC: 30.9 g/dL (ref 30.0–36.0)
MCV: 75.2 fL — ABNORMAL LOW (ref 78.0–100.0)
Monocytes Absolute: 0.6 10*3/uL (ref 0.1–1.0)
Monocytes Relative: 9 % (ref 3–12)
Neutro Abs: 3.3 10*3/uL (ref 1.7–7.7)
Neutrophils Relative %: 56 % (ref 43–77)
Platelets: 302 10*3/uL (ref 150–400)
RBC: 4.08 MIL/uL (ref 3.87–5.11)
RDW: 18.3 % — AB (ref 11.5–15.5)
WBC: 6 10*3/uL (ref 4.0–10.5)

## 2014-05-19 LAB — BASIC METABOLIC PANEL
ANION GAP: 10 (ref 5–15)
BUN: 10 mg/dL (ref 6–23)
CALCIUM: 8.8 mg/dL (ref 8.4–10.5)
CHLORIDE: 104 meq/L (ref 96–112)
CO2: 25 mEq/L (ref 19–32)
Creatinine, Ser: 0.59 mg/dL (ref 0.50–1.10)
GFR calc non Af Amer: >90 mL/min (ref >90)
Glucose, Bld: 106 mg/dL — ABNORMAL HIGH (ref 70–99)
Potassium: 3.5 mEq/L — ABNORMAL LOW (ref 3.7–5.3)
Sodium: 139 mEq/L (ref 137–147)

## 2014-05-19 LAB — URINALYSIS, ROUTINE W REFLEX MICROSCOPIC
Bilirubin Urine: NEGATIVE
Glucose, UA: NEGATIVE mg/dL
Ketones, ur: NEGATIVE mg/dL
Nitrite: NEGATIVE
PROTEIN: 30 mg/dL — AB
Specific Gravity, Urine: 1.034 — ABNORMAL HIGH (ref 1.005–1.030)
UROBILINOGEN UA: 1 mg/dL (ref 0.0–1.0)
pH: 6.5 (ref 5.0–8.0)

## 2014-05-19 LAB — URINE MICROSCOPIC-ADD ON

## 2014-05-19 LAB — POC URINE PREG, ED: PREG TEST UR: NEGATIVE

## 2014-05-19 MED ORDER — OXYCODONE-ACETAMINOPHEN 5-325 MG PO TABS: 2.0000 | ORAL_TABLET | ORAL | Status: DC | PRN

## 2014-05-19 MED ORDER — ONDANSETRON HCL 4 MG/2ML IJ SOLN
4.0000 mg | Freq: Once | INTRAMUSCULAR | Status: AC
  Administered 2014-05-19: 4 mg via INTRAVENOUS
  Filled 2014-05-19: qty 2

## 2014-05-19 MED ORDER — HYDROMORPHONE HCL PF 1 MG/ML IJ SOLN
1.0000 mg | Freq: Once | INTRAMUSCULAR | Status: AC
  Administered 2014-05-19: 1 mg via INTRAVENOUS
  Filled 2014-05-19: qty 1

## 2014-05-19 MED ORDER — ONDANSETRON 8 MG PO TBDP: 8.0000 mg | ORAL_TABLET | Freq: Three times a day (TID) | ORAL | Status: DC | PRN

## 2014-05-19 NOTE — ED Provider Notes (Signed)
CSN: [id number]     Arrival date & time 05/19/14  0328 History   First MD Initiated Contact with Patient 05/19/14 [phone]     Chief Complaint  Patient presents with  . Abdominal Pain     (Consider location/radiation/quality/duration/timing/severity/associated sxs/prior Treatment) HPI 33 year old female presents to emergency room from home with complaint of worsening right lower abdomen and back pain.  Patient started with pain yesterday morning, had some nausea associated with the pain.  She reports feeling of incomplete bladder emptying and urinary frequency.  She woke at 2 AM with worsening of her pain.  Patient seen 2 weeks ago and diagnosed with UTI, patient has completed antibiotics.  Patient started her period yesterday, early for her. Patient reports in the past she is told that she may have passed a kidney stone.  No vaginal discharge, no fevers or chills Past Medical History  Diagnosis Date  . Hypertension   . UTI (lower urinary tract infection)    Past Surgical History  Procedure Laterality Date  . Cholecystectomy    . Tubal ligation     Family History  Problem Relation Age of Onset  . Hypertension Other   . Asthma Other   . Diabetes Other    History  Substance Use Topics  . Smoking status: Current Every Day Smoker -- 0.50 packs/day    Types: Cigars, Cigarettes  . Smokeless tobacco: Not on file  . Alcohol Use: No   OB History   Grav Para Term Preterm Abortions TAB SAB Ect Mult Living                 Review of Systems   See History of Present Illness; otherwise all other systems are reviewed and negative  Allergies  Penicillins  Home Medications   Prior to Admission medications   Medication Sig Start Date End Date Taking? Authorizing Provider  Aspirin-Salicylamide-Caffeine (BC HEADACHE POWDER PO) Take 1 packet by mouth as needed (for pain.).   Yes Historical Provider, MD  diphenhydramine-acetaminophen (TYLENOL PM) 25-500 MG TABS Take 2 tablets by mouth at  bedtime as needed (for pain).   Yes Historical Provider, MD   BP 128/93  Pulse 109  Temp(Src) 98.5 F (36.9 C) (Oral)  Resp 20  Ht 5\' 4"  (1.626 m)  Wt 150 lb (68.04 kg)  BMI 25.73 kg/m2  SpO2 100%  LMP 05/18/2014 Physical Exam  Nursing note and vitals reviewed. Constitutional: She is oriented to person, place, and time. She appears well-developed and well-nourished. She appears distressed.  HENT:  Head: Normocephalic and atraumatic.  Right Ear: External ear normal.  Left Ear: External ear normal.  Nose: Nose normal.  Mouth/Throat: Oropharynx is clear and moist.  Eyes: Conjunctivae and EOM are normal. Pupils are equal, round, and reactive to light.  Neck: Normal range of motion. Neck supple. No JVD present. No tracheal deviation present. No thyromegaly present.  Cardiovascular: Normal rate, regular rhythm, normal heart sounds and intact distal pulses.  Exam reveals no gallop and no friction rub.   No murmur heard. Pulmonary/Chest: Effort normal and breath sounds normal. No stridor. No respiratory distress. She has no wheezes. She has no rales. She exhibits no tenderness.  Abdominal: Soft. Bowel sounds are normal. She exhibits no distension and no mass. There is tenderness (patient has mild abdominal distention, diffuse tenderness to palpation throughout abdomen worse in right lower and right mid abdomen.). There is no rebound and no guarding.  Musculoskeletal: Normal range of motion. She exhibits no edema and no  tenderness.  Lymphadenopathy:    She has no cervical adenopathy.  Neurological: She is alert and oriented to person, place, and time. She exhibits normal muscle tone. Coordination normal.  Skin: Skin is warm and dry. No rash noted. No erythema. No pallor.  Psychiatric: She has a normal mood and affect. Her behavior is normal. Judgment and thought content normal.    ED Course  Procedures (including critical care time) Labs Review Labs Reviewed  URINALYSIS, ROUTINE W  REFLEX MICROSCOPIC - Abnormal; Notable for the following:    Color, Urine RED (*)    APPearance CLOUDY (*)    Specific Gravity, Urine 1.034 (*)    Hgb urine dipstick LARGE (*)    Protein, ur 30 (*)    Leukocytes, UA SMALL (*)    All other components within normal limits  CBC WITH DIFFERENTIAL - Abnormal; Notable for the following:    Hemoglobin 9.5 (*)    HCT 30.7 (*)    MCV 75.2 (*)    MCH 23.3 (*)    RDW 18.3 (*)    All other components within normal limits  BASIC METABOLIC PANEL - Abnormal; Notable for the following:    Potassium 3.5 (*)    Glucose, Bld 106 (*)    All other components within normal limits  URINE MICROSCOPIC-ADD ON - Abnormal; Notable for the following:    Bacteria, UA FEW (*)    All other components within normal limits  POC URINE PREG, ED    Imaging Review Ct Abdomen Pelvis Wo Contrast  05/19/2014   CLINICAL DATA:  Right lower quadrant and right flank pain. White cells and red cells in the urine. Negative urine pregnancy test.  EXAM: CT ABDOMEN AND PELVIS WITHOUT CONTRAST  TECHNIQUE: Multidetector CT imaging of the abdomen and pelvis was performed following the standard protocol without IV contrast.  COMPARISON:  08/07/2013  FINDINGS: Lung bases are clear.  There is a 4 mm stone in the distal right ureter just above the ureterovesical junction. There is proximal hydronephrosis and hydroureter with stranding around the kidney and ureter. Right kidney is mildly enlarged with respect to the left. Additional tiny punctate stones are demonstrated within the right kidney. Additional punctate stones are also demonstrated in the left kidney. No ureteral stone or obstruction on the left. No additional bladder stones. Bladder wall is not thickened.  Surgical absence of the gallbladder. The unenhanced appearance of the liver, spleen, pancreas, adrenal glands, abdominal aorta, inferior vena cava, and retroperitoneal lymph nodes is unremarkable. Stomach, small bowel, and colon are  not abnormally distended. Small umbilical hernia containing fat. No free air or free fluid in the abdomen.  Pelvis: Postoperative changes likely due tubal ligations. Uterus is retroverted without significant enlargement. Previous right adnexal/ ovarian cyst is no longer identified. No free or loculated pelvic fluid collections. Appendix is normal. No evidence of diverticulitis. No destructive bone lesions.  IMPRESSION: 4 mm stone in the distal right ureter with moderate proximal obstruction. Additional nonobstructing intrarenal stones bilaterally. Small umbilical hernia containing fat.   Electronically Signed   By: Lucienne Capers M.D.   On: 05/19/2014 05:53     EKG Interpretation None      MDM   Final diagnoses:  Kidney stone on right side   33 year old female with right-sided abdominal pain, back pain.  Differential includes ectopic pregnancy, ovarian torsion, pyelonephritis, kidney stone, appendicitis.  Plan for labs, pain and nausea medicine.  Given that she just started her period, we'll get in and  out cath for urine.  Patient complains of some sensation of urinary retention, will get bladder scan prior to and out cath.  Kalman Drape, MD 05/19/14 [phone]

## 2014-05-19 NOTE — ED Notes (Signed)
Pt c/o RLQ pain onset 0230, radiating to bilat flanks. Urinary retention, Menses started yesterday.

## 2014-05-19 NOTE — Discharge Instructions (Signed)
You have a 4 mm stone about to pass into your bladder.  This should pass on its own.  Drink plenty of fluids.  Take pain and nausea medications as prescribed.  Return to the ER for worsening pain, or nausea despite medications, fever, or other new concerning symptoms.  Follow up with urology as listed above.  Your CT scan also shows other small kidney stones in both kidneys that may cause problems in the future.  If you have problems with kidney stones either with your current stone or with future stones, the urology group prefers that you be seen at the [place].  They are better equipped to handle complications at the [facility].      Kidney Stones Kidney stones (urolithiasis) are deposits that form inside your kidneys. The intense pain is caused by the stone moving through the urinary tract. When the stone moves, the ureter goes into spasm around the stone. The stone is usually passed in the urine.  CAUSES   A disorder that makes certain neck glands produce too much parathyroid hormone (primary hyperparathyroidism).  A buildup of uric acid crystals, similar to gout in your joints.  Narrowing (stricture) of the ureter.  A kidney obstruction present at birth (congenital obstruction).  Previous surgery on the kidney or ureters.  Numerous kidney infections. SYMPTOMS   Feeling sick to your stomach (nauseous).  Throwing up (vomiting).  Blood in the urine (hematuria).  Pain that usually spreads (radiates) to the groin.  Frequency or urgency of urination. DIAGNOSIS   Taking a history and physical exam.  Blood or urine tests.  CT scan.  Occasionally, an examination of the inside of the urinary bladder (cystoscopy) is performed. TREATMENT   Observation.  Increasing your fluid intake.  Extracorporeal shock wave lithotripsy--This is a noninvasive procedure that uses shock waves to break up kidney stones.  Surgery may be needed if you have severe  pain or persistent obstruction. There are various surgical procedures. Most of the procedures are performed with the use of small instruments. Only small incisions are needed to accommodate these instruments, so recovery time is minimized. The size, location, and chemical composition are all important variables that will determine the proper choice of action for you. Talk to your health care provider to better understand your situation so that you will minimize the risk of injury to yourself and your kidney.  HOME CARE INSTRUCTIONS   Drink enough water and fluids to keep your urine clear or pale yellow. This will help you to pass the stone or stone fragments.  Strain all urine through the provided strainer. Keep all particulate matter and stones for your health care provider to see. The stone causing the pain may be as small as a grain of salt. It is very important to use the strainer each and every time you pass your urine. The collection of your stone will allow your health care provider to analyze it and verify that a stone has actually passed. The stone analysis will often identify what you can do to reduce the incidence of recurrences.  Only take over-the-counter or prescription medicines for pain, discomfort, or fever as directed by your health care provider.  Make a follow-up appointment with your health care provider as directed.  Get follow-up X-rays if required. The absence of pain does not always mean that the stone has passed. It may have only stopped moving. If the urine remains completely obstructed, it can cause loss of  kidney function or even complete destruction of the kidney. It is your responsibility to make sure X-rays and follow-ups are completed. Ultrasounds of the kidney can show blockages and the status of the kidney. Ultrasounds are not associated with any radiation and can be performed easily in a matter of minutes. SEEK MEDICAL CARE IF:  You experience pain that is  progressive and unresponsive to any pain medicine you have been prescribed. SEEK IMMEDIATE MEDICAL CARE IF:   Pain cannot be controlled with the prescribed medicine.  You have a fever or shaking chills.  The severity or intensity of pain increases over 18 hours and is not relieved by pain medicine.  You develop a new onset of abdominal pain.  You feel faint or pass out.  You are unable to urinate. MAKE SURE YOU:   Understand these instructions.  Will watch your condition.  Will get help right away if you are not doing well or get worse. Document Released: 10/07/2005 Document Revised: 06/09/2013 Document Reviewed: 03/10/2013 [facility] Patient Information 2015 [place], [place]. This information is not intended to replace advice given to you by your health care provider. Make sure you discuss any questions you have with your health care provider.

## 2014-05-24 ENCOUNTER — Encounter (HOSPITAL_COMMUNITY): Payer: Self-pay | Admitting: Emergency Medicine

## 2014-05-24 ENCOUNTER — Emergency Department (HOSPITAL_COMMUNITY)
Admission: EM | Admit: 2014-05-24 | Discharge: 2014-05-24 | Disposition: A | Discharge status: Home / Self Care | Payer: Medicaid Other | Attending: Emergency Medicine | Admitting: Emergency Medicine

## 2014-05-24 DIAGNOSIS — Z87440 Personal history of urinary (tract) infections: Secondary | ICD-10-CM | POA: Insufficient documentation

## 2014-05-24 DIAGNOSIS — Z3202 Encounter for pregnancy test, result negative: Secondary | ICD-10-CM | POA: Insufficient documentation

## 2014-05-24 DIAGNOSIS — R1031 Right lower quadrant pain: Secondary | ICD-10-CM

## 2014-05-24 DIAGNOSIS — Z9089 Acquired absence of other organs: Secondary | ICD-10-CM | POA: Insufficient documentation

## 2014-05-24 DIAGNOSIS — N200 Calculus of kidney: Secondary | ICD-10-CM

## 2014-05-24 DIAGNOSIS — I10 Essential (primary) hypertension: Secondary | ICD-10-CM | POA: Insufficient documentation

## 2014-05-24 DIAGNOSIS — R112 Nausea with vomiting, unspecified: Secondary | ICD-10-CM | POA: Insufficient documentation

## 2014-05-24 DIAGNOSIS — Z880 Allergy status to penicillin: Secondary | ICD-10-CM | POA: Insufficient documentation

## 2014-05-24 DIAGNOSIS — R079 Chest pain, unspecified: Secondary | ICD-10-CM | POA: Insufficient documentation

## 2014-05-24 DIAGNOSIS — R000 Tachycardia, unspecified: Secondary | ICD-10-CM | POA: Insufficient documentation

## 2014-05-24 DIAGNOSIS — R197 Diarrhea, unspecified: Secondary | ICD-10-CM | POA: Insufficient documentation

## 2014-05-24 DIAGNOSIS — F17200 Nicotine dependence, unspecified, uncomplicated: Secondary | ICD-10-CM | POA: Insufficient documentation

## 2014-05-24 LAB — BASIC METABOLIC PANEL
Anion gap: 10 (ref 5–15)
BUN: 16 mg/dL (ref 6–23)
CO2: 26 mEq/L (ref 19–32)
Calcium: 9.4 mg/dL (ref 8.4–10.5)
Chloride: 101 mEq/L (ref 96–112)
Creatinine, Ser: 0.68 mg/dL (ref 0.50–1.10)
Glucose, Bld: 93 mg/dL (ref 70–99)
POTASSIUM: 3.9 meq/L (ref 3.7–5.3)
Sodium: 137 mEq/L (ref 137–147)

## 2014-05-24 LAB — URINE MICROSCOPIC-ADD ON

## 2014-05-24 LAB — URINALYSIS, ROUTINE W REFLEX MICROSCOPIC
Bilirubin Urine: NEGATIVE
GLUCOSE, UA: NEGATIVE mg/dL
KETONES UR: NEGATIVE mg/dL
NITRITE: NEGATIVE
PROTEIN: NEGATIVE mg/dL
Specific Gravity, Urine: 1.024 (ref 1.005–1.030)
UROBILINOGEN UA: 1 mg/dL (ref 0.0–1.0)
pH: 6 (ref 5.0–8.0)

## 2014-05-24 LAB — CBC
HCT: 31.5 % — ABNORMAL LOW (ref 36.0–46.0)
Hemoglobin: 9.7 g/dL — ABNORMAL LOW (ref 12.0–15.0)
MCH: 23.3 pg — ABNORMAL LOW (ref 26.0–34.0)
MCHC: 30.8 g/dL (ref 30.0–36.0)
MCV: 75.7 fL — ABNORMAL LOW (ref 78.0–100.0)
PLATELETS: 387 10*3/uL (ref 150–400)
RBC: 4.16 MIL/uL (ref 3.87–5.11)
RDW: 18.4 % — AB (ref 11.5–15.5)
WBC: 9.1 10*3/uL (ref 4.0–10.5)

## 2014-05-24 LAB — POC URINE PREG, ED: Preg Test, Ur: NEGATIVE

## 2014-05-24 MED ORDER — PROMETHAZINE HCL 25 MG PO TABS: 25.0000 mg | ORAL_TABLET | Freq: Four times a day (QID) | ORAL | Status: DC | PRN

## 2014-05-24 MED ORDER — OXYCODONE-ACETAMINOPHEN 5-325 MG PO TABS: 2.0000 | ORAL_TABLET | ORAL | Status: DC | PRN

## 2014-05-24 NOTE — Discharge Instructions (Signed)
Return to the emergency room with worsening of symptoms or with symptoms that are concerning. Do not operate machinery or drive while taking narcotics. Follow up with urology or PCP  Kidney Stones Kidney stones (urolithiasis) are solid masses that form inside your kidneys. The intense pain is caused by the stone moving through the kidney, ureter, bladder, and urethra (urinary tract). When the stone moves, the ureter starts to spasm around the stone. The stone is usually passed in your pee (urine).  HOME CARE  Drink enough fluids to keep your pee clear or pale yellow. This helps to get the stone out.  Strain all pee through the provided strainer. Do not pee without peeing through the strainer, not even once. If you pee the stone out, catch it in the strainer. The stone may be as small as a grain of salt. Take this to your doctor. This will help your doctor figure out what you can do to try to prevent more kidney stones.  Only take medicine as told by your doctor.  Follow up with your doctor as told.  Get follow-up X-rays as told by your doctor. GET HELP IF: You have pain that gets worse even if you have been taking pain medicine. GET HELP RIGHT AWAY IF:   Your pain does not get better with medicine.  You have a fever or shaking chills.  Your pain increases and gets worse over 18 hours.  You have new belly (abdominal) pain.  You feel faint or pass out.  You are unable to pee. MAKE SURE YOU:   Understand these instructions.  Will watch your condition.  Will get help right away if you are not doing well or get worse. Document Released: 03/25/2008 Document Revised: 06/09/2013 Document Reviewed: 03/10/2013 [facility] Patient Information 2015 [place], [place]. This information is not intended to replace advice given to you by your health care provider. Make sure you discuss any questions you have with your health care provider.   Emergency Department Resource Guide 1) Find a Doctor  and Pay Out of Pocket Although you won't have to find out who is covered by your insurance plan, it is a good idea to ask around and get recommendations. You will then need to call the office and see if the doctor you have chosen will accept you as a new patient and what types of options they offer for patients who are self-pay. Some doctors offer discounts or will set up payment plans for their patients who do not have insurance, but you will need to ask so you aren't surprised when you get to your appointment.  2) Contact Your Local Health Department Not all health departments have doctors that can see patients for sick visits, but many do, so it is worth a call to see if yours does. If you don't know where your local health department is, you can check in your phone book. The CDC also has a tool to help you locate your state's health department, and many state websites also have listings of all of their local health departments.  3) Find a [facility] If your illness is not likely to be very severe or complicated, you may want to try a walk in clinic. These are popping up all over the country in pharmacies, drugstores, and shopping centers. They're usually staffed by nurse practitioners or physician assistants that have been trained to treat common illnesses and complaints. They're usually fairly quick and inexpensive. However, if you have serious medical issues or  chronic medical problems, these are probably not your best option.  No Primary Care Doctor: - Call Health Connect at  [phone] - they can help you locate a primary care doctor that  accepts your insurance, provides certain services, etc. - Physician Referral Service- [phone]  Chronic Pain Problems: Organization         Address  Phone   Notes  [facility]  [phone] Patients need to be referred by their primary care doctor.   Medication Assistance: Organization         Address  Phone    Notes  [facility] Medication [facility] [address] [place], [place] [address] [phone] --Must be a resident of [facility] -- Must have NO insurance coverage whatsoever (no Medicaid/ Medicare, etc.) -- The pt. MUST have a primary care doctor that directs their care regularly and follows them in the community   MedAssist  [phone]   [employer]  [phone]    Agencies that provide inexpensive medical care: Organization         Address  Phone   Notes  [place]  [phone]   [facility]    [phone]   [facility] [place], [place] [phone]   [place] [address], [place] [phone]   Planned Parenthood    [phone]   [facility]    [phone]   [place] and [place] [address], [place] Phone:  [phone], Fax:  [phone] Hours of Operation:  9 am - 6 pm, M-F.  Also accepts Medicaid/Medicare and self-pay.  [facility] for [place] [address], [address], [place] Phone: [phone], Fax: [phone]. Hours of Operation:  8:30 am - 5:30 pm, M-F.  Also accepts Medicaid and self-pay.  [facility] [place] [address] [place] Phone: [phone]   [place], [place], [place] [phone], Ext. [phone] Mondays & Thursdays: 7-9 AM.  First 15 patients are seen on a first come, first serve basis.    [place] Providers:  Organization         Address  Phone   Notes  [facility] [address], [place] [phone] Also accepts self-pay patients.  [facility] [address], [place]  [phone]   [place], [address], [place] [phone]   [facility] [address], [place] [phone]   Lucianne Lei [address], [place]   [phone] Only accepts [place] Access [place] patients after they have their name applied to their card.   Self-Pay (no insurance) in [facility]:  Organization         Address  Phone   Notes  Sickle Cell Patients, [facility] [place] [phone]   [facility] [place] [phone]   [facility] [place]  [place], [place], [place] [phone]   [facility]/Dr. Osei-Bonsu  [address] [place] or [address], [place] [phone] Phone number for both [place] and [place] locations is the same.  Urgent Medical and [facility] [address] [place] [phone]   [facility] [address] [place] or [address] [phone]   [facility] [address], [place] [phone], phone; [phone], fax Sees patients 1st and 3rd Saturday of every month.  Must not qualify for public or private insurance (i.e. Medicaid, Medicare, [place] Health Choice, Veterans' Benefits)  Household income should be no more than 200% of the poverty level The clinic cannot treat you if you are pregnant or think you are pregnant  Sexually transmitted diseases are not treated at the clinic.    Dental Care: Organization         Address  Phone  Notes  [facility] Department of [facility] [phone] Accepts children up to age 50 who are enrolled in [place] or [place]; pregnant women with a Medicaid card; and children who have applied for Medicaid or [place] Health Choice, but were declined, whose parents can pay a reduced fee at time of service.  [facility] Department of [facility]  [address], [place] [phone] Accepts children up to age 28 who are enrolled in [place] or [place]; pregnant women with a Medicaid card; and children who have applied for Medicaid or  Health Choice, but were declined, whose parents can pay a reduced fee at time of service.  [facility] Adult [facility] Access PROGRAM  [place] [phone] Patients are seen by appointment only. Walk-ins are not accepted. [facility] will see patients 96 years of age and older. Monday - Tuesday (8am-5pm) Most Wednesdays (8:30-5pm) $30 per visit, cash only  [facility] Adult Dental Access PROGRAM  [address], [facility] [phone] Patients are seen by appointment only. Walk-ins are not accepted. [place] will see patients 66 years of age and older. One Wednesday Evening (Monthly: Volunteer Based).  $30 per visit, cash only  [place]  [phone] for adults; Children under age 22, call Graduate Pediatric Dentistry at [phone]. Children aged 46-14, please call [phone] to request a pediatric application.  Dental services are provided in all areas of dental care including fillings, crowns and bridges, complete and partial dentures, implants, gum treatment, root canals, and extractions. Preventive care is also provided. Treatment is provided to both adults and children. Patients are selected via a lottery and there is often a waiting list.   [facility] [address] [place]  [phone] [email or web address]   [facility] [address] [place], [place] [phone], Ext. [phone] Second and Fourth Thursday of each month, opens at 6:30 AM; Clinic ends at 9 AM.  Patients are seen on a first-come first-served basis, and a limited number are seen during each clinic.   [facility]  [address] Hillard Danker [place], [place] [phone]   Eligibility Requirements You must have lived in  [place], [place], or [place] counties for at least the last three months.   You cannot be eligible for state or federal sponsored [employer], including [employer], [place], or [employer].   You generally cannot be eligible for healthcare insurance through your employer.    How to apply: Eligibility screenings are held every Tuesday and Wednesday afternoon from 1:00 pm until 4:00 pm. You do not need an appointment for the interview!  [facility] [address], [place], [place]   [place]  [place] Department  [place]  [phone]    Behavioral Health Resources in the Community: Intensive Outpatient Programs Organization         Address  Phone  Notes  [place]. [address], [place], [place] [phone]   [facility] Outpatient [address] [place], [place]   ADS: Alcohol & Drug Svcs [address], [place], [place]   [place] [address],  [place], [place] or [phone]   Substance Abuse Resources Organization         Address  Phone  Notes  Alcohol and Drug Services  [phone]   [place]  [phone]   [facility] [place]   Chinita Pester  [phone]   Residential & Outpatient Substance Abuse Program  [phone]   Psychological Services Organization         Address  Phone  Notes  [facility]  [place]  [phone]   [place] [address] [place] or [phone]    Mobile Crisis Teams Organization         Address  Phone  Notes  [facility], Mobile Crisis Care Unit  [phone]   [facility]otherapeutic [facility]  [address] [place], [place]   [place] [address], [place] [phone]    Self-Help/Support Groups Organization         Address  Phone             Notes  [place]. of [place] - variety of support groups  [place] Call for more information  Narcotics Anonymous (NA), [facility]aring [facility] [address], [employer] [place]  2 meetings at this location   Special educational needs teacher         Address  Phone  Notes  ASAP Residential Treatment [place],    [place]  [phone]   [facility]  [address], [place] [address], [place], [place]   [place], [place] [phone] Admissions: 8am-3pm M-F  Incentives Substance [place] [address]    [place], [place] [phone]   [facility] [address] [place], [place], [place]   [facility] [address]  [place], [place]   [facility]ight Programs - Intensive Outpatient [address] Kristeen Mans [address], [place], [place]   [facility] ([place].,  [place], [place] [phone] or [phone]   [facility]idential Treatment Services (RTS) [address] [place], [place] Accepts Medicaid  [facility] [place] [address],  [place] [phone] Substance Abuse/Addiction Treatment   [facility] Organization         Address  Phone  Notes  [facility]  [phone]   Domenic Schwab, PhD [address] Arlis Porta [place], [place]   [phone] or [phone]   [place], [place] [phone]   [place] [address] [place], [place] [phone] Insurance/Medicaid/sponsorship through [employer] and Families [address], [address]  [place], [place] [phone] [place], [place] [phone]    Dr. Adele Schilder  [phone]   [facility] Dept. 1) [address] [place] 2) [place] 3)  [place] [address], [place] [phone]  [phone]   [place] [phone] or [phone] (After Hours)

## 2014-05-24 NOTE — ED Notes (Addendum)
Urine collected and sent to lab.

## 2014-05-24 NOTE — ED Provider Notes (Signed)
CSN: [id number]     Arrival date & time 05/24/14  2025 History   First MD Initiated Contact with Patient 05/24/14 2227     Chief Complaint  Patient presents with  . Flank Pain     Patient is a 33 y.o. female presenting with flank pain.  Flank Pain Associated symptoms include chest pain, nausea and vomiting. Pertinent negatives include no chills, congestion, coughing, fever, headaches or weakness.   Patient is a 33 year old with with 31mm kidney stone in ureter 05/19/14 who presents with flank pain. Pain started at 4 today and she took a percocet which helped with the pain. Pain is in RLQ and radiates to right flank. Pain waxes and wans and has increased in intensity. Patient denies passing the stone, hematuria or dysuria. Endorses incomplete emptying, frequency. Patient complains of nausea and was prescribed zofran which she said was too expensive. Patient also complains of itchiness when taking percocet. Patient denies fever, chills.   Past Medical History  Diagnosis Date  . Hypertension   . UTI (lower urinary tract infection)    Past Surgical History  Procedure Laterality Date  . Cholecystectomy    . Tubal ligation     Family History  Problem Relation Age of Onset  . Hypertension Other   . Asthma Other   . Diabetes Other    History  Substance Use Topics  . Smoking status: Current Every Day Smoker -- 0.50 packs/day    Types: Cigars, Cigarettes  . Smokeless tobacco: Not on file  . Alcohol Use: No   OB History   Grav Para Term Preterm Abortions TAB SAB Ect Mult Living                 Review of Systems  Constitutional: Negative for fever and chills.  HENT: Negative for congestion and rhinorrhea.   Respiratory: Negative for cough and shortness of breath.   Cardiovascular: Positive for chest pain.  Gastrointestinal: Positive for nausea, vomiting and diarrhea. Negative for blood in stool.  Genitourinary: Positive for dysuria, frequency and flank pain. Negative for hematuria,  vaginal discharge, difficulty urinating and vaginal pain.  Skin: Negative for color change.  Neurological: Negative for weakness and headaches.  Psychiatric/Behavioral: Negative for behavioral problems, confusion and agitation.      Allergies  Penicillins  Home Medications   Prior to Admission medications   Medication Sig Start Date End Date Taking? Authorizing Provider  oxyCODONE-acetaminophen (PERCOCET/ROXICET) 5-325 MG per tablet Take 2 tablets by mouth every 4 (four) hours as needed for severe pain. 05/19/14  Yes Kalman Drape, MD  oxyCODONE-acetaminophen (PERCOCET/ROXICET) 5-325 MG per tablet Take 2 tablets by mouth every 4 (four) hours as needed for severe pain. 05/24/14   Pura Spice, PA-C  promethazine (PHENERGAN) 25 MG tablet Take 1 tablet (25 mg total) by mouth every 6 (six) hours as needed for nausea or vomiting. 05/24/14   Pura Spice, PA-C   BP 137/89  Pulse 93  Temp(Src) 98.4 F (36.9 C) (Oral)  Resp 22  Ht 5\' 4"  (1.626 m)  Wt 145 lb (65.772 kg)  BMI 24.88 kg/m2  SpO2 100%  LMP 05/18/2014 Physical Exam  Vitals reviewed. Constitutional: She is oriented to person, place, and time. She appears well-developed and well-nourished. No distress.  HENT:  Head: Normocephalic and atraumatic.  Eyes: EOM are normal.  Neck: Neck supple.  Cardiovascular: Regular rhythm.   No murmur heard. tachycardic  Pulmonary/Chest: Effort normal and breath sounds normal. No respiratory distress.  She has no wheezes.  Abdominal: Soft. Bowel sounds are normal. She exhibits no distension and no mass.  Left lower quandrant tenderness with voluntary guarding No rebound. Right CVA tenderness  Musculoskeletal: She exhibits no edema.  Neurological: She is alert and oriented to person, place, and time.  Skin: Skin is warm and dry. She is not diaphoretic.    ED Course  Procedures (including critical care time) Labs Review Labs Reviewed  URINALYSIS, ROUTINE W REFLEX MICROSCOPIC -  Abnormal; Notable for the following:    APPearance CLOUDY (*)    Hgb urine dipstick LARGE (*)    Leukocytes, UA TRACE (*)    All other components within normal limits  CBC - Abnormal; Notable for the following:    Hemoglobin 9.7 (*)    HCT 31.5 (*)    MCV 75.7 (*)    MCH 23.3 (*)    RDW 18.4 (*)    All other components within normal limits  BASIC METABOLIC PANEL  URINE MICROSCOPIC-ADD ON  POC URINE PREG, ED    Imaging Review No results found.   EKG Interpretation None      MDM   Final diagnoses:  Nephrolithiasis  Abdominal pain, RLQ   Patient with known kidney stone presents with flank pain. UA with trace leukocytes and large HGB. Patient without fever, tachycardia, no UTI. Doubt pyelonephritis.  Patient has an established kidney stone and presents with chronic pain since diagnosis. Refilled percocet script today but discussed that further medical management of pain due to stone needs to be with urologist or PCP.  Take benadryl at night for itchiness or nondrowsy antihistamines during the day. Take phenergan for nausea PRN Patient additionally given resources to establish care with a PCP.  Discussed return precautions with patient. Discussed all results and patient verbalizes understanding and agrees with plan.     Pura Spice, PA-C 05/24/14 [phone]

## 2014-05-24 NOTE — ED Notes (Addendum)
Pt reports flank pain x 1 week intermittently, became severe last night.  Vomited x 2 days ago.  Pt has hx of kidney stones.  Pt is in NAD.  Denies any hematuria at this time.

## 2014-05-24 NOTE — ED Notes (Signed)
Pt states she was seen here last week with flank pain and was diagnosed with kidney stone  Pt states today she is having right flank pain  Pt states she took her pain medication without relief

## 2014-05-26 NOTE — ED Provider Notes (Signed)
Medical screening examination/treatment/procedure(s) were conducted as a shared visit with non-physician practitioner(s) and myself.  I personally evaluated the patient during the encounter.   EKG Interpretation None      Patient seen and evaluated. Symptoms are controlled. Benign abdomen. Hematuria. History of kidney stones. Plan be symptomatic treatment. I agree with assessment and plan as per PA Creech.  Tanna Furry, MD 05/26/14 [phone]

## 2014-12-09 ENCOUNTER — Encounter (HOSPITAL_COMMUNITY): Payer: Self-pay | Admitting: Emergency Medicine

## 2014-12-09 ENCOUNTER — Emergency Department (HOSPITAL_COMMUNITY)
Admission: EM | Admit: 2014-12-09 | Discharge: 2014-12-09 | Disposition: A | Discharge status: Home / Self Care | Payer: Medicaid Other | Attending: Emergency Medicine | Admitting: Emergency Medicine

## 2014-12-09 DIAGNOSIS — Y9389 Activity, other specified: Secondary | ICD-10-CM | POA: Insufficient documentation

## 2014-12-09 DIAGNOSIS — M25552 Pain in left hip: Secondary | ICD-10-CM

## 2014-12-09 DIAGNOSIS — S79912A Unspecified injury of left hip, initial encounter: Secondary | ICD-10-CM | POA: Insufficient documentation

## 2014-12-09 DIAGNOSIS — S199XXA Unspecified injury of neck, initial encounter: Secondary | ICD-10-CM | POA: Insufficient documentation

## 2014-12-09 DIAGNOSIS — S24109A Unspecified injury at unspecified level of thoracic spinal cord, initial encounter: Secondary | ICD-10-CM | POA: Insufficient documentation

## 2014-12-09 DIAGNOSIS — M62838 Other muscle spasm: Secondary | ICD-10-CM

## 2014-12-09 DIAGNOSIS — S4992XA Unspecified injury of left shoulder and upper arm, initial encounter: Secondary | ICD-10-CM | POA: Insufficient documentation

## 2014-12-09 DIAGNOSIS — S3992XA Unspecified injury of lower back, initial encounter: Secondary | ICD-10-CM | POA: Insufficient documentation

## 2014-12-09 DIAGNOSIS — Z87440 Personal history of urinary (tract) infections: Secondary | ICD-10-CM | POA: Insufficient documentation

## 2014-12-09 DIAGNOSIS — Z720 Tobacco use: Secondary | ICD-10-CM | POA: Insufficient documentation

## 2014-12-09 DIAGNOSIS — Y998 Other external cause status: Secondary | ICD-10-CM | POA: Insufficient documentation

## 2014-12-09 DIAGNOSIS — S0990XA Unspecified injury of head, initial encounter: Secondary | ICD-10-CM | POA: Insufficient documentation

## 2014-12-09 DIAGNOSIS — M7062 Trochanteric bursitis, left hip: Secondary | ICD-10-CM | POA: Insufficient documentation

## 2014-12-09 DIAGNOSIS — Y92410 Unspecified street and highway as the place of occurrence of the external cause: Secondary | ICD-10-CM | POA: Insufficient documentation

## 2014-12-09 DIAGNOSIS — I10 Essential (primary) hypertension: Secondary | ICD-10-CM | POA: Insufficient documentation

## 2014-12-09 MED ORDER — NAPROXEN 500 MG PO TABS: 500.0000 mg | ORAL_TABLET | Freq: Two times a day (BID) | ORAL | Status: DC | PRN

## 2014-12-09 MED ORDER — CYCLOBENZAPRINE HCL 10 MG PO TABS: 10.0000 mg | ORAL_TABLET | Freq: Three times a day (TID) | ORAL | Status: DC | PRN
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
## 2015-06-14 ENCOUNTER — Emergency Department (HOSPITAL_COMMUNITY)
Admission: EM | Admit: 2015-06-14 | Discharge: 2015-06-15 | Disposition: A | Discharge status: Home / Self Care | Payer: Medicaid Other | Attending: Emergency Medicine | Admitting: Emergency Medicine

## 2015-06-14 ENCOUNTER — Encounter (HOSPITAL_COMMUNITY): Payer: Self-pay | Admitting: Emergency Medicine

## 2015-06-14 DIAGNOSIS — Z720 Tobacco use: Secondary | ICD-10-CM | POA: Insufficient documentation

## 2015-06-14 DIAGNOSIS — M79641 Pain in right hand: Secondary | ICD-10-CM

## 2015-06-14 DIAGNOSIS — Z880 Allergy status to penicillin: Secondary | ICD-10-CM | POA: Insufficient documentation

## 2015-06-14 DIAGNOSIS — R519 Headache, unspecified: Secondary | ICD-10-CM

## 2015-06-14 DIAGNOSIS — R51 Headache: Secondary | ICD-10-CM | POA: Insufficient documentation

## 2015-06-14 DIAGNOSIS — Z87440 Personal history of urinary (tract) infections: Secondary | ICD-10-CM | POA: Insufficient documentation

## 2015-06-14 DIAGNOSIS — I10 Essential (primary) hypertension: Secondary | ICD-10-CM | POA: Insufficient documentation

## 2015-06-14 NOTE — ED Notes (Signed)
Pt is c/o headache for the past few days  Pt states she has taken [facility] powders without relief  Pt states light makes the headache worse and states she has nausea with the pain   Pt is also c/o right hand pain that she has had for a few weeks  Pt states she is unable to use her hand due to the pain   Pt denies injury

## 2015-06-15 MED ORDER — METOCLOPRAMIDE HCL 10 MG PO TABS
5.0000 mg | ORAL_TABLET | Freq: Once | ORAL | Status: AC
  Administered 2015-06-15: 5 mg via ORAL
  Filled 2015-06-15: qty 1

## 2015-06-15 MED ORDER — KETOROLAC TROMETHAMINE 30 MG/ML IJ SOLN
30.0000 mg | Freq: Once | INTRAMUSCULAR | Status: AC
  Administered 2015-06-15: 30 mg via INTRAMUSCULAR
  Filled 2015-06-15: qty 1

## 2015-06-15 MED ORDER — DIPHENHYDRAMINE HCL 25 MG PO CAPS
25.0000 mg | ORAL_CAPSULE | Freq: Once | ORAL | Status: AC
Start: 2015-06-15 — End: 2015-06-15
  Administered 2015-06-15: 25 mg via ORAL
  Filled 2015-06-15: qty 1

## 2015-06-15 NOTE — Discharge Instructions (Signed)
Please monitor for return of symptoms. If concerning signs or symptoms present return daily for further evaluation. Please follow-up with primary care for further evaluation and management. Please use ice and Tylenol as needed for pain of the hand.

## 2015-06-15 NOTE — ED Provider Notes (Signed)
CSN: [id number]     Arrival date & time 06/14/15  1921 History   First MD Initiated Contact with Patient 06/14/15 2307     Chief Complaint  Patient presents with  . Headache  . Hand Pain   HPI   34 year old female presents today with headache. Patient reports history of the same, identical symptoms, bilateral frontal throbbing; photophobia. Patient denies any radiation of symptoms, focal neurological deficits, fever, neck stiffness, or any other red flags. No trauma to the head. She reports symptoms have been present for the last several days, has been taking BC powder's without relief.  Patient also reports pain to her right hand. She notes that several days ago she was using right hand at work when she felt pain in the hand, she reports the pain has waxed and waned but has not significantly improved. Patient reports she has not been in attempting any over-the-counter therapies for this pain. She denies loss of strength sensation are perfusion of the hand.     Past Medical History  Diagnosis Date  . Hypertension   . UTI (lower urinary tract infection)    Past Surgical History  Procedure Laterality Date  . Cholecystectomy    . Tubal ligation     Family History  Problem Relation Age of Onset  . Hypertension Other   . Asthma Other   . Diabetes Other    Social History  Substance Use Topics  . Smoking status: Current Every Day Smoker -- 0.50 packs/day    Types: Cigars, Cigarettes  . Smokeless tobacco: None  . Alcohol Use: No   OB History    No data available     Review of Systems  All other systems reviewed and are negative.     Allergies  Penicillins  Home Medications   Prior to Admission medications   Not on File   BP 137/90 mmHg  Pulse 89  Temp(Src) 98.2 F (36.8 C) (Oral)  Resp 16  Ht 5\' 4"  (1.626 m)  Wt 153 lb (69.4 kg)  BMI 26.25 kg/m2  SpO2 100%  LMP 05/22/2015 (Approximate)   Physical Exam  Constitutional: She is oriented to person, place, and  time. She appears well-developed and well-nourished.  HENT:  Head: Normocephalic and atraumatic.  Eyes: Conjunctivae are normal. Pupils are equal, round, and reactive to light. Right eye exhibits no discharge. Left eye exhibits no discharge. No scleral icterus.  Neck: Normal range of motion. No JVD present. No tracheal deviation present.  Pulmonary/Chest: Effort normal. No stridor.  Musculoskeletal:  Right hand no obvious signs of trauma or deformity. No signs of infection. Sensation intact, grip strength reduced due to pain. Refill less than 3 seconds. No pain with compression of the carpal tunnel.   Neurological: She is alert and oriented to person, place, and time. She has normal strength. No cranial nerve deficit or sensory deficit. Coordination and gait normal. GCS eye subscore is 4. GCS verbal subscore is 5. GCS motor subscore is 6.  Psychiatric: She has a normal mood and affect. Her behavior is normal. Judgment and thought content normal.  Nursing note and vitals reviewed.   ED Course  Procedures (including critical care time) Labs Review Labs Reviewed - No data to display  Imaging Review No results found. I have personally reviewed and evaluated these images and lab results as part of my medical decision-making.   EKG Interpretation None      MDM   Final diagnoses:  Nonintractable headache, unspecified chronicity pattern, unspecified  headache type  Hand pain, right    Labs:  Imaging:  Consults:  Therapeutics: Toradol, Reglan, Benadryl  Discharge Meds:   Assessment/Plan: Patient presents with headache, similar to previous. Slow onset, no trauma, no red flags. Symptoms 100% resolved here in the ED with the above medications. Patient presents with hand pain, no signs of infection or trauma. Likely muscular in nature. Patient is instructed to use ibuprofen or Tylenol, ice, rest, follow-up with primary care if symptoms do not improve. Patient given strict return  precautions, she verbalized understanding and agreement to today's plan.         Okey Regal, PA-C 06/15/15 1420  Orlie Dakin, MD 06/15/15 [phone]

## 2016-06-05 ENCOUNTER — Emergency Department (HOSPITAL_COMMUNITY)
Admission: EM | Admit: 2016-06-05 | Discharge: 2016-06-05 | Disposition: A | Discharge status: Home / Self Care | Payer: Self-pay | Attending: Emergency Medicine | Admitting: Emergency Medicine

## 2016-06-05 ENCOUNTER — Encounter (HOSPITAL_COMMUNITY): Payer: Self-pay

## 2016-06-05 ENCOUNTER — Emergency Department (HOSPITAL_COMMUNITY): Payer: Self-pay

## 2016-06-05 DIAGNOSIS — Y92410 Unspecified street and highway as the place of occurrence of the external cause: Secondary | ICD-10-CM | POA: Insufficient documentation

## 2016-06-05 DIAGNOSIS — Y999 Unspecified external cause status: Secondary | ICD-10-CM | POA: Insufficient documentation

## 2016-06-05 DIAGNOSIS — I10 Essential (primary) hypertension: Secondary | ICD-10-CM | POA: Insufficient documentation

## 2016-06-05 DIAGNOSIS — Y939 Activity, unspecified: Secondary | ICD-10-CM | POA: Insufficient documentation

## 2016-06-05 DIAGNOSIS — R0789 Other chest pain: Secondary | ICD-10-CM | POA: Insufficient documentation

## 2016-06-05 DIAGNOSIS — F17210 Nicotine dependence, cigarettes, uncomplicated: Secondary | ICD-10-CM | POA: Insufficient documentation

## 2016-06-05 DIAGNOSIS — S39012A Strain of muscle, fascia and tendon of lower back, initial encounter: Secondary | ICD-10-CM | POA: Insufficient documentation

## 2016-06-05 MED ORDER — NAPROXEN 500 MG PO TABS: 500.0000 mg | ORAL_TABLET | Freq: Two times a day (BID) | ORAL | 0 refills | Status: DC

## 2016-06-05 MED ORDER — CYCLOBENZAPRINE HCL 10 MG PO TABS: 10.0000 mg | ORAL_TABLET | Freq: Two times a day (BID) | ORAL | 0 refills | Status: DC | PRN

## 2016-06-05 NOTE — ED Notes (Signed)
Patient Alert and oriented X4. Stable and ambulatory. Patient verbalized understanding of the discharge instructions.  Patient belongings were taken by the patient.  

## 2016-06-05 NOTE — ED Notes (Signed)
[employer] in room with patient
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
## 2016-06-06 MED FILL — CYCLOBENZAPRINE 10 MG TAB: 10 | 10 days supply | Qty: 20 | Fill #0

## 2016-06-06 MED FILL — NAPROXEN 500 MG TABLET: 500 | 15 days supply | Qty: 30 | Fill #0

## 2016-06-26 ENCOUNTER — Emergency Department (HOSPITAL_COMMUNITY)
Admission: EM | Admit: 2016-06-26 | Discharge: 2016-06-26 | Disposition: A | Discharge status: Home / Self Care | Payer: No Typology Code available for payment source | Attending: Emergency Medicine | Admitting: Emergency Medicine

## 2016-06-26 ENCOUNTER — Encounter (HOSPITAL_COMMUNITY): Payer: Self-pay | Admitting: Emergency Medicine

## 2016-06-26 ENCOUNTER — Emergency Department (HOSPITAL_COMMUNITY): Payer: No Typology Code available for payment source

## 2016-06-26 DIAGNOSIS — F17210 Nicotine dependence, cigarettes, uncomplicated: Secondary | ICD-10-CM | POA: Diagnosis not present

## 2016-06-26 DIAGNOSIS — I10 Essential (primary) hypertension: Secondary | ICD-10-CM | POA: Insufficient documentation

## 2016-06-26 DIAGNOSIS — Z79899 Other long term (current) drug therapy: Secondary | ICD-10-CM | POA: Diagnosis not present

## 2016-06-26 DIAGNOSIS — Y9389 Activity, other specified: Secondary | ICD-10-CM | POA: Insufficient documentation

## 2016-06-26 DIAGNOSIS — Z041 Encounter for examination and observation following transport accident: Secondary | ICD-10-CM | POA: Diagnosis present

## 2016-06-26 DIAGNOSIS — R202 Paresthesia of skin: Secondary | ICD-10-CM

## 2016-06-26 DIAGNOSIS — Y92410 Unspecified street and highway as the place of occurrence of the external cause: Secondary | ICD-10-CM | POA: Insufficient documentation

## 2016-06-26 DIAGNOSIS — Y999 Unspecified external cause status: Secondary | ICD-10-CM | POA: Diagnosis not present
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
## 2016-06-27 ENCOUNTER — Encounter (HOSPITAL_COMMUNITY): Payer: Self-pay | Admitting: *Deleted

## 2016-06-27 ENCOUNTER — Emergency Department (HOSPITAL_COMMUNITY): Payer: No Typology Code available for payment source

## 2016-06-27 ENCOUNTER — Emergency Department (HOSPITAL_COMMUNITY)
Admission: EM | Admit: 2016-06-27 | Discharge: 2016-06-27 | Disposition: A | Discharge status: Home / Self Care | Payer: No Typology Code available for payment source | Attending: Emergency Medicine | Admitting: Emergency Medicine

## 2016-06-27 DIAGNOSIS — R202 Paresthesia of skin: Secondary | ICD-10-CM | POA: Diagnosis not present

## 2016-06-27 DIAGNOSIS — Y9389 Activity, other specified: Secondary | ICD-10-CM | POA: Diagnosis not present

## 2016-06-27 DIAGNOSIS — I10 Essential (primary) hypertension: Secondary | ICD-10-CM | POA: Diagnosis not present

## 2016-06-27 DIAGNOSIS — F17210 Nicotine dependence, cigarettes, uncomplicated: Secondary | ICD-10-CM | POA: Diagnosis not present

## 2016-06-27 DIAGNOSIS — Z79899 Other long term (current) drug therapy: Secondary | ICD-10-CM | POA: Insufficient documentation

## 2016-06-27 DIAGNOSIS — Y92410 Unspecified street and highway as the place of occurrence of the external cause: Secondary | ICD-10-CM | POA: Diagnosis not present

## 2016-06-27 DIAGNOSIS — Y999 Unspecified external cause status: Secondary | ICD-10-CM | POA: Diagnosis not present

## 2016-06-27 DIAGNOSIS — Z7982 Long term (current) use of aspirin: Secondary | ICD-10-CM | POA: Diagnosis not present

## 2016-06-27 DIAGNOSIS — M542 Cervicalgia: Secondary | ICD-10-CM

## 2016-06-27 DIAGNOSIS — M79601 Pain in right arm: Secondary | ICD-10-CM | POA: Diagnosis present
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
## 2016-06-27 NOTE — Discharge Instructions (Signed)
Please follow up with pcp to re evaluate and need for further workup.

## 2016-12-20 ENCOUNTER — Emergency Department (HOSPITAL_COMMUNITY)
Admission: EM | Admit: 2016-12-20 | Discharge: 2016-12-20 | Disposition: A | Discharge status: Home / Self Care | Payer: Self-pay | Attending: Emergency Medicine | Admitting: Emergency Medicine

## 2016-12-20 ENCOUNTER — Encounter (HOSPITAL_COMMUNITY): Payer: Self-pay | Admitting: Emergency Medicine

## 2016-12-20 ENCOUNTER — Emergency Department (HOSPITAL_COMMUNITY): Payer: Self-pay

## 2016-12-20 DIAGNOSIS — I10 Essential (primary) hypertension: Secondary | ICD-10-CM | POA: Insufficient documentation

## 2016-12-20 DIAGNOSIS — F17210 Nicotine dependence, cigarettes, uncomplicated: Secondary | ICD-10-CM | POA: Insufficient documentation

## 2016-12-20 DIAGNOSIS — Z79899 Other long term (current) drug therapy: Secondary | ICD-10-CM | POA: Insufficient documentation

## 2016-12-20 DIAGNOSIS — Z7982 Long term (current) use of aspirin: Secondary | ICD-10-CM | POA: Insufficient documentation

## 2016-12-20 DIAGNOSIS — R109 Unspecified abdominal pain: Secondary | ICD-10-CM

## 2016-12-20 DIAGNOSIS — R1031 Right lower quadrant pain: Secondary | ICD-10-CM | POA: Insufficient documentation

## 2016-12-20 LAB — COMPREHENSIVE METABOLIC PANEL
ALT: 25 U/L (ref 14–54)
AST: 25 U/L (ref 15–41)
Albumin: 3.9 g/dL (ref 3.5–5.0)
Alkaline Phosphatase: 54 U/L (ref 38–126)
Anion gap: 6 (ref 5–15)
BUN: 8 mg/dL (ref 6–20)
CO2: 24 mmol/L (ref 22–32)
CREATININE: 0.5 mg/dL (ref 0.44–1.00)
Calcium: 8.9 mg/dL (ref 8.9–10.3)
Chloride: 105 mmol/L (ref 101–111)
GFR calc Af Amer: >60 mL/min (ref >60)
Glucose, Bld: 90 mg/dL (ref 65–99)
Potassium: 3.3 mmol/L — ABNORMAL LOW (ref 3.5–5.1)
Sodium: 135 mmol/L (ref 135–145)
Total Bilirubin: 0.4 mg/dL (ref 0.3–1.2)
Total Protein: 7.7 g/dL (ref 6.5–8.1)

## 2016-12-20 LAB — URINALYSIS, ROUTINE W REFLEX MICROSCOPIC
Bilirubin Urine: NEGATIVE
GLUCOSE, UA: NEGATIVE mg/dL
Ketones, ur: NEGATIVE mg/dL
NITRITE: NEGATIVE
PH: 6 (ref 5.0–8.0)
Protein, ur: NEGATIVE mg/dL
SPECIFIC GRAVITY, URINE: 1.019 (ref 1.005–1.030)

## 2016-12-20 LAB — I-STAT BETA HCG BLOOD, ED (MC, WL, AP ONLY)

## 2016-12-20 LAB — CBC
HCT: 30.5 % — ABNORMAL LOW (ref 36.0–46.0)
Hemoglobin: 9.1 g/dL — ABNORMAL LOW (ref 12.0–15.0)
MCH: 21 pg — ABNORMAL LOW (ref 26.0–34.0)
MCHC: 29.8 g/dL — ABNORMAL LOW (ref 30.0–36.0)
MCV: 70.3 fL — ABNORMAL LOW (ref 78.0–100.0)
PLATELETS: 307 10*3/uL (ref 150–400)
RBC: 4.34 MIL/uL (ref 3.87–5.11)
RDW: 17 % — ABNORMAL HIGH (ref 11.5–15.5)
WBC: 7.5 10*3/uL (ref 4.0–10.5)

## 2016-12-20 LAB — LIPASE, BLOOD: Lipase: 19 U/L (ref 11–51)

## 2016-12-20 MED ORDER — IBUPROFEN 400 MG PO TABS: 400.0000 mg | ORAL_TABLET | Freq: Three times a day (TID) | ORAL | 0 refills | Status: AC | PRN

## 2016-12-20 MED ORDER — SODIUM CHLORIDE 0.9 % IV BOLUS (SEPSIS)
1000.0000 mL | Freq: Once | INTRAVENOUS | Status: AC
  Administered 2016-12-20: 1000 mL via INTRAVENOUS

## 2016-12-20 MED ORDER — ONDANSETRON 8 MG PO TBDP: 8.0000 mg | ORAL_TABLET | Freq: Three times a day (TID) | ORAL | 0 refills | Status: AC | PRN

## 2016-12-20 MED ORDER — MORPHINE SULFATE (PF) 4 MG/ML IV SOLN
6.0000 mg | Freq: Once | INTRAVENOUS | Status: AC
  Administered 2016-12-20: 6 mg via INTRAVENOUS
  Filled 2016-12-20: qty 2

## 2016-12-20 MED ORDER — KETOROLAC TROMETHAMINE 30 MG/ML IJ SOLN
30.0000 mg | Freq: Once | INTRAMUSCULAR | Status: AC
  Administered 2016-12-20: 30 mg via INTRAVENOUS
  Filled 2016-12-20: qty 1

## 2016-12-20 NOTE — ED Notes (Signed)
Requested urine sample.  

## 2016-12-20 NOTE — ED Provider Notes (Signed)
[facility] DEPT Provider Note   CSN: [id number] Arrival date & time: 12/20/16  0950     History   Chief Complaint Chief Complaint  Patient presents with  . Abdominal Pain    HPI Molly [name] is a 36 y.o. female.  HPI Patient presents emergency department with complaints of intermittent right-sided flank and right-sided abdominal pain without nausea vomiting or diarrhea.  No urinary symptoms.  No vaginal complaints.  Her pain is been present since yesterday and coming and going.  She does have a history kidney stones.  No other complaints at this time.  Symptoms are mild to moderate in severity.   Past Medical History:  Diagnosis Date  . Hypertension   . UTI (lower urinary tract infection)     Patient Active Problem List   Diagnosis Date Noted  . ANEMIA 11/20/2010  . GERD 11/20/2010  . CHOLELITHIASIS 11/20/2010  . SLEEP APNEA 11/20/2010  . ABDOMINAL PAIN, LEFT LOWER QUADRANT 11/20/2010    Past Surgical History:  Procedure Laterality Date  . CHOLECYSTECTOMY    . TUBAL LIGATION      OB History    No data available       Home Medications    Prior to Admission medications   Medication Sig Start Date End Date Taking? Authorizing Provider  Aspirin-Salicylamide-Caffeine (BC HEADACHE POWDER PO) Take 1 packet by mouth every 4 (four) hours as needed (for pain).    Yes Historical Provider, MD  aspirin-sod bicarb-citric acid (ALKA-SELTZER) 325 MG TBEF tablet Take 325 mg by mouth daily as needed (for acid reflex).   Yes Historical Provider, MD  ibuprofen (ADVIL,MOTRIN) 400 MG tablet Take 1 tablet (400 mg total) by mouth every 8 (eight) hours as needed. 12/20/16   Jola Schmidt, MD  ondansetron (ZOFRAN ODT) 8 MG disintegrating tablet Take 1 tablet (8 mg total) by mouth every 8 (eight) hours as needed for nausea or vomiting. 12/20/16   Jola Schmidt, MD    Family History Family History  Problem Relation Age of Onset  . Hypertension Other   . Asthma Other   . Diabetes  Other     Social History Social History  Substance Use Topics  . Smoking status: Current Every Day Smoker    Packs/day: 0.50    Types: Cigars, Cigarettes  . Smokeless tobacco: Never Used  . Alcohol use No     Allergies   Penicillins   Review of Systems Review of Systems  All other systems reviewed and are negative.    Physical Exam Updated Vital Signs BP 127/85   Pulse 81   Temp 97.6 F (36.4 C)   Resp 18   Wt 150 lb (68 kg)   LMP 12/03/2016 Comment: negative HCG blood test 12-20-2016  SpO2 100%   BMI 26.57 kg/m   Physical Exam  Constitutional: She is oriented to person, place, and time. She appears well-developed and well-nourished. No distress.  HENT:  Head: Normocephalic and atraumatic.  Eyes: EOM are normal.  Neck: Normal range of motion.  Cardiovascular: Normal rate, regular rhythm and normal heart sounds.   Pulmonary/Chest: Effort normal and breath sounds normal.  Abdominal: Soft. She exhibits no distension. There is no tenderness.  Musculoskeletal: Normal range of motion.  Neurological: She is alert and oriented to person, place, and time.  Skin: Skin is warm and dry.  Psychiatric: She has a normal mood and affect. Judgment normal.  Nursing note and vitals reviewed.    ED Treatments / Results  Labs (all labs  ordered are listed, but only abnormal results are displayed) Labs Reviewed  COMPREHENSIVE METABOLIC PANEL - Abnormal; Notable for the following:       Result Value   Potassium 3.3 (*)    All other components within normal limits  CBC - Abnormal; Notable for the following:    Hemoglobin 9.1 (*)    HCT 30.5 (*)    MCV 70.3 (*)    MCH 21.0 (*)    MCHC 29.8 (*)    RDW 17.0 (*)    All other components within normal limits  URINALYSIS, ROUTINE W REFLEX MICROSCOPIC - Abnormal; Notable for the following:    APPearance HAZY (*)    Hgb urine dipstick SMALL (*)    Leukocytes, UA SMALL (*)    Bacteria, UA RARE (*)    Squamous Epithelial / LPF  6-30 (*)    All other components within normal limits  LIPASE, BLOOD  I-STAT BETA HCG BLOOD, ED ([facility], [facility], [facility] ONLY)    EKG  EKG Interpretation None       Radiology Ct Renal Stone Study  Result Date: 12/20/2016 CLINICAL DATA:  Intermittent lower right abdominal pain. EXAM: CT ABDOMEN AND PELVIS WITHOUT CONTRAST TECHNIQUE: Multidetector CT imaging of the abdomen and pelvis was performed following the standard protocol without IV contrast. COMPARISON:  None. FINDINGS: Lower chest: No acute abnormality. Hepatobiliary: No focal liver abnormality is seen. Status post cholecystectomy. No biliary dilatation. Pancreas: Unremarkable. No pancreatic ductal dilatation or surrounding inflammatory changes. Spleen: Normal in size without focal abnormality. Adrenals/Urinary Tract: Normal adrenal glands. Bilateral nonobstructing nephrolithiasis. No renal mass. No hydronephrosis. Normal bladder. Stomach/Bowel: Stomach is within normal limits. Appendix appears normal. No evidence of bowel wall thickening, distention, or inflammatory changes. Vascular/Lymphatic: Normal caliber abdominal aorta. No lymphadenopathy. Reproductive: Uterus and bilateral adnexa are unremarkable. Other: Fat containing umbilical hernia. No abdominal or pelvic ascites. Musculoskeletal: No acute osseous abnormality. No lytic or sclerotic osseous lesion. IMPRESSION: 1. No acute abdominal or pelvic pathology. 2. Bilateral nonobstructing nephrolithiasis. Electronically Signed   By: Kathreen Devoid   On: 12/20/2016 14:38    Procedures Procedures (including critical care time)  Medications Ordered in ED Medications  morphine 4 MG/ML injection 6 mg (6 mg Intravenous Given 12/20/16 1301)  sodium chloride 0.9 % bolus 1,000 mL (0 mLs Intravenous Stopped 12/20/16 1339)  ketorolac (TORADOL) 30 MG/ML injection 30 mg (30 mg Intravenous Given 12/20/16 1301)     Initial Impression / Assessment and Plan / ED Course  I have reviewed the triage vital signs and  the nursing notes.  Pertinent labs & imaging results that were available during my care of the patient were reviewed by me and considered in my medical decision making (see chart for details).     Has and feels better this time.  Labs and urine without significant abnormality.  CT scan demonstrates no ureteral stones.  Primary care follow-up.  She understands return to the ER for new or worsening symptoms  Final Clinical Impressions(s) / ED Diagnoses   Final diagnoses:  Abdominal pain, unspecified abdominal location    New Prescriptions Discharge Medication List as of 12/20/2016  4:42 PM    START taking these medications   Details  ibuprofen (ADVIL,MOTRIN) 400 MG tablet Take 1 tablet (400 mg total) by mouth every 8 (eight) hours as needed., Starting Fri 12/20/2016, Print    ondansetron (ZOFRAN ODT) 8 MG disintegrating tablet Take 1 tablet (8 mg total) by mouth every 8 (eight) hours as needed for nausea  or vomiting., Starting Fri 12/20/2016, Print         Jola Schmidt, MD 12/20/16 [phone]

## 2016-12-20 NOTE — ED Notes (Signed)
Pt went to CT

## 2016-12-20 NOTE — ED Triage Notes (Signed)
Pt complaint of intermittent lower right abdominal pain without n/v/d or urinary symptoms since yesterday.

## 2016-12-20 NOTE — ED Notes (Signed)
Waiting for CT results

## 2017-02-18 ENCOUNTER — Emergency Department (HOSPITAL_COMMUNITY): Payer: Self-pay

## 2017-02-18 ENCOUNTER — Emergency Department (HOSPITAL_COMMUNITY)
Admission: EM | Admit: 2017-02-18 | Discharge: 2017-02-18 | Disposition: A | Discharge status: Home / Self Care | Payer: Self-pay | Attending: Emergency Medicine | Admitting: Emergency Medicine

## 2017-02-18 ENCOUNTER — Encounter (HOSPITAL_COMMUNITY): Payer: Self-pay | Admitting: *Deleted

## 2017-02-18 DIAGNOSIS — Z79899 Other long term (current) drug therapy: Secondary | ICD-10-CM | POA: Insufficient documentation

## 2017-02-18 DIAGNOSIS — I10 Essential (primary) hypertension: Secondary | ICD-10-CM | POA: Insufficient documentation

## 2017-02-18 DIAGNOSIS — Z7982 Long term (current) use of aspirin: Secondary | ICD-10-CM | POA: Insufficient documentation

## 2017-02-18 DIAGNOSIS — F17210 Nicotine dependence, cigarettes, uncomplicated: Secondary | ICD-10-CM | POA: Insufficient documentation

## 2017-02-18 DIAGNOSIS — R109 Unspecified abdominal pain: Secondary | ICD-10-CM

## 2017-02-18 DIAGNOSIS — R1032 Left lower quadrant pain: Secondary | ICD-10-CM | POA: Insufficient documentation

## 2017-02-18 LAB — URINALYSIS, ROUTINE W REFLEX MICROSCOPIC
BILIRUBIN URINE: NEGATIVE
Glucose, UA: NEGATIVE mg/dL
KETONES UR: 5 mg/dL — AB
NITRITE: NEGATIVE
PH: 6 (ref 5.0–8.0)
PROTEIN: NEGATIVE mg/dL
Specific Gravity, Urine: 1.024 (ref 1.005–1.030)

## 2017-02-18 LAB — COMPREHENSIVE METABOLIC PANEL
ALBUMIN: 4.2 g/dL (ref 3.5–5.0)
ALT: 23 U/L (ref 14–54)
ANION GAP: 10 (ref 5–15)
AST: 25 U/L (ref 15–41)
Alkaline Phosphatase: 65 U/L (ref 38–126)
BILIRUBIN TOTAL: 0.5 mg/dL (ref 0.3–1.2)
BUN: 7 mg/dL (ref 6–20)
CALCIUM: 8.9 mg/dL (ref 8.9–10.3)
CO2: 24 mmol/L (ref 22–32)
Chloride: 102 mmol/L (ref 101–111)
Creatinine, Ser: 0.53 mg/dL (ref 0.44–1.00)
GFR calc non Af Amer: >60 mL/min (ref >60)
GLUCOSE: 82 mg/dL (ref 65–99)
POTASSIUM: 3 mmol/L — AB (ref 3.5–5.1)
Sodium: 136 mmol/L (ref 135–145)
TOTAL PROTEIN: 8.3 g/dL — AB (ref 6.5–8.1)

## 2017-02-18 LAB — CBC
HEMATOCRIT: 31.5 % — AB (ref 36.0–46.0)
HEMOGLOBIN: 9.4 g/dL — AB (ref 12.0–15.0)
MCH: 20.9 pg — ABNORMAL LOW (ref 26.0–34.0)
MCHC: 29.8 g/dL — AB (ref 30.0–36.0)
MCV: 70.2 fL — ABNORMAL LOW (ref 78.0–100.0)
Platelets: 301 10*3/uL (ref 150–400)
RBC: 4.49 MIL/uL (ref 3.87–5.11)
RDW: 16.9 % — ABNORMAL HIGH (ref 11.5–15.5)
WBC: 9.4 10*3/uL (ref 4.0–10.5)

## 2017-02-18 LAB — LIPASE, BLOOD: Lipase: 15 U/L (ref 11–51)

## 2017-02-18 LAB — WET PREP, GENITAL
SPERM: NONE SEEN
TRICH WET PREP: NONE SEEN
Yeast Wet Prep HPF POC: NONE SEEN

## 2017-02-18 LAB — POC URINE PREG, ED: Preg Test, Ur: NEGATIVE

## 2017-02-18 MED ORDER — HYDROCODONE-ACETAMINOPHEN 5-325 MG PO TABS
1.0000 | ORAL_TABLET | Freq: Once | ORAL | Status: AC
  Administered 2017-02-18: 1 via ORAL
  Filled 2017-02-18: qty 1

## 2017-02-18 MED ORDER — ONDANSETRON HCL 4 MG/2ML IJ SOLN
4.0000 mg | Freq: Once | INTRAMUSCULAR | Status: AC
  Administered 2017-02-18: 4 mg via INTRAVENOUS
  Filled 2017-02-18: qty 2

## 2017-02-18 MED ORDER — KETOROLAC TROMETHAMINE 30 MG/ML IJ SOLN
30.0000 mg | Freq: Once | INTRAMUSCULAR | Status: AC
  Administered 2017-02-18: 30 mg via INTRAVENOUS
  Filled 2017-02-18: qty 1

## 2017-02-18 MED ORDER — ONDANSETRON 8 MG PO TBDP: 8.0000 mg | ORAL_TABLET | Freq: Three times a day (TID) | ORAL | 0 refills | Status: AC | PRN

## 2017-02-18 MED ORDER — POTASSIUM CHLORIDE CRYS ER 20 MEQ PO TBCR
40.0000 meq | EXTENDED_RELEASE_TABLET | Freq: Once | ORAL | Status: AC
  Administered 2017-02-18: 40 meq via ORAL
  Filled 2017-02-18: qty 2

## 2017-02-18 MED ORDER — HYDROCODONE-ACETAMINOPHEN 5-325 MG PO TABS: 1.0000 | ORAL_TABLET | Freq: Four times a day (QID) | ORAL | 0 refills | Status: DC | PRN

## 2017-02-18 NOTE — Discharge Instructions (Signed)
Avoid strenuous activity. Take tylenol or motrin for pain. Take norco for severe pain. Zofran for nausea and vomiting. Follow up with your doctor if not improving. Return if worsening symptoms.

## 2017-02-18 NOTE — ED Notes (Signed)
US at bedside

## 2017-02-18 NOTE — ED Provider Notes (Signed)
[facility] DEPT Provider Note   CSN: [id number] Arrival date & time: 02/18/17  1435     History   Chief Complaint Chief Complaint  Patient presents with  . Abdominal Pain    HPI Nusrat Encarnacion is a 36 y.o. female.  HPI Khamani Daniely is a 36 y.o. female presents to emergency department complaining of abdominal pain. Patient states she is having pain in the left lower quadrant, states pain radiates around the back. History of kidney stones but states it feels different. Pain is dull, worsened with any movement. Denies any vomiting or diarrhea. She admits to some nausea. No blood in her stool. No urinary symptoms. No vaginal discharge or bleeding. Has not tried taking anything for her pain prior to coming in. No prior abdominal surgeries. Denies any fever or chills.   Past Medical History:  Diagnosis Date  . Hypertension   . UTI (lower urinary tract infection)     Patient Active Problem List   Diagnosis Date Noted  . ANEMIA 11/20/2010  . GERD 11/20/2010  . CHOLELITHIASIS 11/20/2010  . SLEEP APNEA 11/20/2010  . ABDOMINAL PAIN, LEFT LOWER QUADRANT 11/20/2010    Past Surgical History:  Procedure Laterality Date  . CHOLECYSTECTOMY    . TUBAL LIGATION      OB History    No data available       Home Medications    Prior to Admission medications   Medication Sig Start Date End Date Taking? Authorizing Provider  Aspirin-Salicylamide-Caffeine (BC HEADACHE POWDER PO) Take 1 packet by mouth every 4 (four) hours as needed (for pain).    Yes Historical Provider, MD  aspirin-sod bicarb-citric acid (ALKA-SELTZER) 325 MG TBEF tablet Take 325 mg by mouth daily as needed (for acid reflex).   Yes Historical Provider, MD  bismuth subsalicylate (PEPTO BISMOL) 262 MG/15ML suspension Take 30 mLs by mouth every 6 (six) hours as needed.   Yes Historical Provider, MD  ibuprofen (ADVIL,MOTRIN) 400 MG tablet Take 1 tablet (400 mg total) by mouth every 8 (eight) hours as needed. Patient not  taking: Reported on 02/18/2017 12/20/16   Jola Schmidt, MD  ondansetron [facility] ODT) 8 MG disintegrating tablet Take 1 tablet (8 mg total) by mouth every 8 (eight) hours as needed for nausea or vomiting. Patient not taking: Reported on 02/18/2017 12/20/16   Jola Schmidt, MD    Family History Family History  Problem Relation Age of Onset  . Hypertension Other   . Asthma Other   . Diabetes Other     Social History Social History  Substance Use Topics  . Smoking status: Current Every Day Smoker    Packs/day: 1.00    Types: Cigarettes  . Smokeless tobacco: Never Used  . Alcohol use No     Allergies   Penicillins   Review of Systems Review of Systems  Constitutional: Negative for chills and fever.  Respiratory: Negative for cough, chest tightness and shortness of breath.   Cardiovascular: Negative for chest pain, palpitations and leg swelling.  Gastrointestinal: Positive for abdominal pain and nausea. Negative for diarrhea and vomiting.  Genitourinary: Negative for dysuria, flank pain, pelvic pain, vaginal bleeding, vaginal discharge and vaginal pain.  Musculoskeletal: Negative for arthralgias, myalgias, neck pain and neck stiffness.  Skin: Negative for rash.  Neurological: Negative for dizziness, weakness and headaches.  All other systems reviewed and are negative.    Physical Exam Updated Vital Signs BP (!) 144/90 (BP Location: Right Arm)   Pulse 100   Temp 100 F (  37.8 C) (Oral)   Resp 16   Ht 5\' 3"  (1.6 m)   Wt 68.9 kg   LMP 01/25/2017   SpO2 100%   BMI 26.93 kg/m   Physical Exam  Constitutional: She appears well-developed and well-nourished. No distress.  HENT:  Head: Normocephalic.  Eyes: Conjunctivae are normal.  Neck: Neck supple.  Cardiovascular: Normal rate, regular rhythm and normal heart sounds.   Pulmonary/Chest: Effort normal and breath sounds normal. No respiratory distress. She has no wheezes. She has no rales.  Abdominal: Soft. Bowel sounds are  normal. She exhibits no distension. There is tenderness. There is no rebound.  Left lower quadrant tenderness. No CVA tenderness.  Genitourinary:  Genitourinary Comments: Normal external genitalia. Normal vaginal canal. Small thin white discharge. Cervix is normal, closed. No CMT. Uterine and left adnexal tenderness. No masses palpated  Musculoskeletal: She exhibits no edema.  Neurological: She is alert.  Skin: Skin is warm and dry.  Psychiatric: She has a normal mood and affect. Her behavior is normal.  Nursing note and vitals reviewed.    ED Treatments / Results  Labs (all labs ordered are listed, but only abnormal results are displayed) Labs Reviewed  WET PREP, GENITAL - Abnormal; Notable for the following:       Result Value   Clue Cells Wet Prep HPF POC PRESENT (*)    WBC, Wet Prep HPF POC FEW (*)    All other components within normal limits  COMPREHENSIVE METABOLIC PANEL - Abnormal; Notable for the following:    Potassium 3.0 (*)    Total Protein 8.3 (*)    All other components within normal limits  CBC - Abnormal; Notable for the following:    Hemoglobin 9.4 (*)    HCT 31.5 (*)    MCV 70.2 (*)    MCH 20.9 (*)    MCHC 29.8 (*)    RDW 16.9 (*)    All other components within normal limits  URINALYSIS, ROUTINE W REFLEX MICROSCOPIC - Abnormal; Notable for the following:    APPearance HAZY (*)    Hgb urine dipstick SMALL (*)    Ketones, ur 5 (*)    Leukocytes, UA TRACE (*)    Bacteria, UA MANY (*)    Squamous Epithelial / LPF 6-30 (*)    All other components within normal limits  LIPASE, BLOOD  POC URINE PREG, ED  GC/CHLAMYDIA PROBE AMP ([place]) NOT AT [facility]    EKG  EKG Interpretation None       Radiology US Transvaginal Non-ob  Result Date: 02/18/2017 CLINICAL DATA:  Left lower quadrant pain for 1 day EXAM: TRANSABDOMINAL AND TRANSVAGINAL ULTRASOUND OF PELVIS DOPPLER ULTRASOUND OF OVARIES TECHNIQUE: Both transabdominal and transvaginal ultrasound  examinations of the pelvis were performed. Transabdominal technique was performed for global imaging of the pelvis including uterus, ovaries, adnexal regions, and pelvic cul-de-sac. It was necessary to proceed with endovaginal exam following the transabdominal exam to visualize the endometrium and ovaries. Color and duplex Doppler ultrasound was utilized to evaluate blood flow to the ovaries. COMPARISON:  CT 12/20/2016 FINDINGS: Uterus Measurements: 9.7 x 5.6 x 6 cm. Linear echodense focus at the left posterior fundus, appears to correspond to metallic density in the left pelvis on recent CT configuration of which is suggestive of a tubal ligation device. Endometrium Thickness: 9.9 mm.  No focal abnormality visualized. Right ovary Measurements: 4.4 x 1.8 x 3 cm. Normal appearance/no adnexal mass. Left ovary Measurements: 4.6 x 1.5 x 3.6 cm. Normal  appearance/no adnexal mass. Pulsed Doppler evaluation of both ovaries demonstrates normal low-resistance arterial and venous waveforms. Other findings No abnormal free fluid. IMPRESSION: 1. Negative for ovarian torsion 2. Linear echodense focus at the left posterior fundus appears to correspond to a metallic density on recent CT, and likely represents intrauterine portion of a tubal ligation device. Electronically Signed   By: Donavan Foil M.D.   On: 02/18/2017 19:58   US Pelvis Complete  Result Date: 02/18/2017 CLINICAL DATA:  Left lower quadrant pain for 1 day EXAM: TRANSABDOMINAL AND TRANSVAGINAL ULTRASOUND OF PELVIS DOPPLER ULTRASOUND OF OVARIES TECHNIQUE: Both transabdominal and transvaginal ultrasound examinations of the pelvis were performed. Transabdominal technique was performed for global imaging of the pelvis including uterus, ovaries, adnexal regions, and pelvic cul-de-sac. It was necessary to proceed with endovaginal exam following the transabdominal exam to visualize the endometrium and ovaries. Color and duplex Doppler ultrasound was utilized to evaluate  blood flow to the ovaries. COMPARISON:  CT 12/20/2016 FINDINGS: Uterus Measurements: 9.7 x 5.6 x 6 cm. Linear echodense focus at the left posterior fundus, appears to correspond to metallic density in the left pelvis on recent CT configuration of which is suggestive of a tubal ligation device. Endometrium Thickness: 9.9 mm.  No focal abnormality visualized. Right ovary Measurements: 4.4 x 1.8 x 3 cm. Normal appearance/no adnexal mass. Left ovary Measurements: 4.6 x 1.5 x 3.6 cm. Normal appearance/no adnexal mass. Pulsed Doppler evaluation of both ovaries demonstrates normal low-resistance arterial and venous waveforms. Other findings No abnormal free fluid. IMPRESSION: 1. Negative for ovarian torsion 2. Linear echodense focus at the left posterior fundus appears to correspond to a metallic density on recent CT, and likely represents intrauterine portion of a tubal ligation device. Electronically Signed   By: Donavan Foil M.D.   On: 02/18/2017 19:58   US Renal  Result Date: 02/18/2017 CLINICAL DATA:  Left lower quadrant pain 2 days with nausea, vomiting and hematuria. EXAM: RENAL / URINARY TRACT ULTRASOUND COMPLETE COMPARISON:  CT 12/20/2016 FINDINGS: Right Kidney: Length: 12.5 cm. Echogenicity within normal limits. No mass or hydronephrosis visualized. Left Kidney: Length: 12.0 cm. Echogenicity within normal limits. No mass or hydronephrosis visualized. Bladder: Appears normal for degree of bladder distention. IMPRESSION: Normal renal ultrasound. Electronically Signed   By: Marin Olp M.D.   On: 02/18/2017 19:42   [place] Art/ven Flow Abd Pelv Doppler  Result Date: 02/18/2017 CLINICAL DATA:  Left lower quadrant pain for 1 day EXAM: TRANSABDOMINAL AND TRANSVAGINAL ULTRASOUND OF PELVIS DOPPLER ULTRASOUND OF OVARIES TECHNIQUE: Both transabdominal and transvaginal ultrasound examinations of the pelvis were performed. Transabdominal technique was performed for global imaging of the pelvis including uterus, ovaries,  adnexal regions, and pelvic cul-de-sac. It was necessary to proceed with endovaginal exam following the transabdominal exam to visualize the endometrium and ovaries. Color and duplex Doppler ultrasound was utilized to evaluate blood flow to the ovaries. COMPARISON:  CT 12/20/2016 FINDINGS: Uterus Measurements: 9.7 x 5.6 x 6 cm. Linear echodense focus at the left posterior fundus, appears to correspond to metallic density in the left pelvis on recent CT configuration of which is suggestive of a tubal ligation device. Endometrium Thickness: 9.9 mm.  No focal abnormality visualized. Right ovary Measurements: 4.4 x 1.8 x 3 cm. Normal appearance/no adnexal mass. Left ovary Measurements: 4.6 x 1.5 x 3.6 cm. Normal appearance/no adnexal mass. Pulsed Doppler evaluation of both ovaries demonstrates normal low-resistance arterial and venous waveforms. Other findings No abnormal free fluid. IMPRESSION: 1. Negative for ovarian torsion 2.  Linear echodense focus at the left posterior fundus appears to correspond to a metallic density on recent CT, and likely represents intrauterine portion of a tubal ligation device. Electronically Signed   By: Donavan Foil M.D.   On: 02/18/2017 19:58    Procedures Procedures (including critical care time)  Medications Ordered in ED Medications  potassium chloride SA (K-DUR,KLOR-CON) CR tablet 40 mEq (not administered)  ketorolac (TORADOL) 30 MG/ML injection 30 mg (30 mg Intravenous Given 02/18/17 1810)  ondansetron (ZOFRAN) injection 4 mg (4 mg Intravenous Given 02/18/17 1810)  HYDROcodone-acetaminophen (NORCO/VICODIN) 5-325 MG per tablet 1 tablet (1 tablet Oral Given 02/18/17 1842)     Initial Impression / Assessment and Plan / ED Course  I have reviewed the triage vital signs and the nursing notes.  Pertinent labs & imaging results that were available during my care of the patient were reviewed by me and considered in my medical decision making (see chart for details).      Patient in emergency department with left lower quadrant abdominal pain. She has been seen in ED for similar pain in the past. Labs obtained at triage show potassium of 3, hemoglobin of 9.4 which appears to be at baseline. Will perform pelvic exam. Patient does have history of kidney stones, however her pain does not feel like prior kidney stone. Will get ultrasound renal and of the adnexa.  Ultrasound is negative. I reviewed patient's chart, patient does have history of kidney stones but again states this does not feel the same. She has had similar pain documented in the past. I question whether this could be musculoskeletal. She has not had any changes in her bowels, do not suspect colitis. Her white blood cell count is normal. I will treat her with pain medications and anti-medics. Advised follow-up with family doctor as needed if pain does not improve. Return precautions discussed.  Vitals:   02/18/17 1450 02/18/17 1751 02/18/17 1924  BP: (!) 137/91 (!) 144/90 (!) 143/80  Pulse: (!) 102 100 96  Resp: 18 16 16   Temp: 100 F (37.8 C)    TempSrc: Oral    SpO2: 100% 100% 99%  Weight: 69.1 kg 68.9 kg   Height: 5\' 3"  (1.6 m) 5\' 3"  (1.6 m)        Final Clinical Impressions(s) / ED Diagnoses   Final diagnoses:  Abdominal pain  Left lower quadrant pain    New Prescriptions Discharge Medication List as of 02/18/2017  8:13 PM    START taking these medications   Details  HYDROcodone-acetaminophen (NORCO) 5-325 MG tablet Take 1 tablet by mouth every 6 (six) hours as needed for moderate pain., Starting Tue 02/18/2017, Print    !! ondansetron (ZOFRAN ODT) 8 MG disintegrating tablet Take 1 tablet (8 mg total) by mouth every 8 (eight) hours as needed for nausea or vomiting., Starting Tue 02/18/2017, Print     !! - Potential duplicate medications found. Please discuss with provider.       Jeannett Senior, PA-C 02/19/17 9628    Julianne Rice, MD 02/23/17 1056

## 2017-02-18 NOTE — ED Triage Notes (Signed)
Pt presents with left lower abd pain that began Monday morning.  Pt reports sharp in her inner thighs.  Pt reports emesis x 7 since yesterday.  Pt denies fever or chills.  Pt a/o x 4 and ambulatory in triage.

## 2017-02-19 LAB — GC/CHLAMYDIA PROBE AMP (~~LOC~~) NOT AT ARMC
Chlamydia: NEGATIVE
NEISSERIA GONORRHEA: NEGATIVE

## 2017-07-11 ENCOUNTER — Encounter (HOSPITAL_COMMUNITY): Payer: Self-pay | Admitting: Emergency Medicine

## 2017-07-11 DIAGNOSIS — I10 Essential (primary) hypertension: Secondary | ICD-10-CM | POA: Insufficient documentation

## 2017-07-11 DIAGNOSIS — D649 Anemia, unspecified: Secondary | ICD-10-CM | POA: Insufficient documentation

## 2017-07-11 DIAGNOSIS — F17210 Nicotine dependence, cigarettes, uncomplicated: Secondary | ICD-10-CM | POA: Insufficient documentation

## 2017-07-11 DIAGNOSIS — R110 Nausea: Secondary | ICD-10-CM | POA: Insufficient documentation

## 2017-07-11 DIAGNOSIS — Z79899 Other long term (current) drug therapy: Secondary | ICD-10-CM | POA: Insufficient documentation

## 2017-07-11 DIAGNOSIS — R51 Headache: Secondary | ICD-10-CM | POA: Insufficient documentation

## 2017-07-11 LAB — CBC
HCT: 26.6 % — ABNORMAL LOW (ref 36.0–46.0)
HEMOGLOBIN: 7.7 g/dL — AB (ref 12.0–15.0)
MCH: 19.7 pg — ABNORMAL LOW (ref 26.0–34.0)
MCHC: 28.9 g/dL — AB (ref 30.0–36.0)
MCV: 68 fL — ABNORMAL LOW (ref 78.0–100.0)
Platelets: 369 10*3/uL (ref 150–400)
RBC: 3.91 MIL/uL (ref 3.87–5.11)
RDW: 18 % — ABNORMAL HIGH (ref 11.5–15.5)
WBC: 6.6 10*3/uL (ref 4.0–10.5)

## 2017-07-11 LAB — URINALYSIS, ROUTINE W REFLEX MICROSCOPIC
BILIRUBIN URINE: NEGATIVE
Bacteria, UA: NONE SEEN
GLUCOSE, UA: NEGATIVE mg/dL
Ketones, ur: NEGATIVE mg/dL
NITRITE: NEGATIVE
PROTEIN: NEGATIVE mg/dL
Specific Gravity, Urine: 1.012 (ref 1.005–1.030)
pH: 7 (ref 5.0–8.0)

## 2017-07-11 LAB — I-STAT BETA HCG BLOOD, ED (MC, WL, AP ONLY): I-stat hCG, quantitative: <5 m[IU]/mL (ref <5)

## 2017-07-11 NOTE — ED Triage Notes (Signed)
Reports having headache since this morning and nausea that started this afternoon.

## 2017-07-12 ENCOUNTER — Emergency Department (HOSPITAL_COMMUNITY)
Admission: EM | Admit: 2017-07-12 | Discharge: 2017-07-12 | Disposition: A | Discharge status: Home / Self Care | Payer: Self-pay | Attending: Emergency Medicine | Admitting: Emergency Medicine

## 2017-07-12 DIAGNOSIS — D649 Anemia, unspecified: Secondary | ICD-10-CM

## 2017-07-12 DIAGNOSIS — R51 Headache: Secondary | ICD-10-CM

## 2017-07-12 DIAGNOSIS — R519 Headache, unspecified: Secondary | ICD-10-CM

## 2017-07-12 LAB — COMPREHENSIVE METABOLIC PANEL
ALBUMIN: 3.7 g/dL (ref 3.5–5.0)
ALT: 18 U/L (ref 14–54)
ANION GAP: 6 (ref 5–15)
AST: 20 U/L (ref 15–41)
Alkaline Phosphatase: 56 U/L (ref 38–126)
BUN: 6 mg/dL (ref 6–20)
CHLORIDE: 106 mmol/L (ref 101–111)
CO2: 25 mmol/L (ref 22–32)
Calcium: 9 mg/dL (ref 8.9–10.3)
Creatinine, Ser: 0.53 mg/dL (ref 0.44–1.00)
GFR calc Af Amer: >60 mL/min (ref >60)
GFR calc non Af Amer: >60 mL/min (ref >60)
GLUCOSE: 87 mg/dL (ref 65–99)
POTASSIUM: 3.1 mmol/L — AB (ref 3.5–5.1)
Sodium: 137 mmol/L (ref 135–145)
Total Bilirubin: 0.4 mg/dL (ref 0.3–1.2)
Total Protein: 6.8 g/dL (ref 6.5–8.1)

## 2017-07-12 LAB — LIPASE, BLOOD: LIPASE: 33 U/L (ref 11–51)

## 2017-07-12 MED ORDER — DIPHENHYDRAMINE HCL 50 MG/ML IJ SOLN
25.0000 mg | Freq: Once | INTRAMUSCULAR | Status: AC
  Administered 2017-07-12: 25 mg via INTRAVENOUS
  Filled 2017-07-12: qty 1

## 2017-07-12 MED ORDER — SODIUM CHLORIDE 0.9 % IV BOLUS (SEPSIS)
1000.0000 mL | Freq: Once | INTRAVENOUS | Status: AC
  Administered 2017-07-12: 1000 mL via INTRAVENOUS

## 2017-07-12 MED ORDER — METOCLOPRAMIDE HCL 5 MG/ML IJ SOLN
10.0000 mg | Freq: Once | INTRAMUSCULAR | Status: AC
  Administered 2017-07-12: 10 mg via INTRAVENOUS
  Filled 2017-07-12: qty 2

## 2017-07-12 MED ORDER — KETOROLAC TROMETHAMINE 30 MG/ML IJ SOLN
30.0000 mg | Freq: Once | INTRAMUSCULAR | Status: AC
  Administered 2017-07-12: 30 mg via INTRAVENOUS
  Filled 2017-07-12: qty 1

## 2017-07-12 MED ORDER — DEXAMETHASONE SODIUM PHOSPHATE 10 MG/ML IJ SOLN
10.0000 mg | Freq: Once | INTRAMUSCULAR | Status: AC
  Administered 2017-07-12: 10 mg via INTRAVENOUS
  Filled 2017-07-12: qty 1

## 2017-07-12 NOTE — ED Notes (Signed)
Attempted to d/c pt however pt could not stay awake long enough to reassess her pain or go over d/c instructions.

## 2017-07-12 NOTE — ED Notes (Signed)
ED Provider at bedside. 

## 2017-07-12 NOTE — ED Notes (Signed)
No answer in waiting area.

## 2017-07-12 NOTE — Discharge Instructions (Signed)
Follow-up with your primary Dr. if not improving in the next week.  Ibuprofen 600 mg every 6 hours as needed for pain.  Begin taking a multivitamin with iron once daily.

## 2017-07-12 NOTE — ED Provider Notes (Signed)
[facility] DEPT Provider Note   CSN: [id number] Arrival date & time: 07/11/17  2246     History   Chief Complaint Chief Complaint  Patient presents with  . Nausea  . Headache    HPI Molly [name] is a 36 y.o. female.  Patient is a 36 year old female with past mental history of migraine headaches, anemia, and gallstones. She presents today for evaluation of headache. This started yesterday and is worsening. It is located primarily on the left side of her head and is associated with nausea, but no vomiting. She denies any fevers, chills, or stiff neck. She has tried taking medications at home with little relief.   The history is provided by the patient.  Headache   This is a new problem. The current episode started 12 to 24 hours ago. The problem occurs constantly. The problem has been rapidly worsening. The headache is associated with nothing. The pain is located in the left unilateral region. The quality of the pain is described as throbbing. The pain is moderate. The pain does not radiate. Associated symptoms include nausea. Pertinent negatives include no anorexia, no fever and no vomiting. She has tried NSAIDs for the symptoms. The treatment provided no relief.    Past Medical History:  Diagnosis Date  . Hypertension   . UTI (lower urinary tract infection)     Patient Active Problem List   Diagnosis Date Noted  . ANEMIA 11/20/2010  . GERD 11/20/2010  . CHOLELITHIASIS 11/20/2010  . SLEEP APNEA 11/20/2010  . ABDOMINAL PAIN, LEFT LOWER QUADRANT 11/20/2010    Past Surgical History:  Procedure Laterality Date  . CHOLECYSTECTOMY    . TUBAL LIGATION      OB History    No data available       Home Medications    Prior to Admission medications   Medication Sig Start Date End Date Taking? Authorizing Provider  Aspirin-Salicylamide-Caffeine (BC HEADACHE POWDER PO) Take 1 packet by mouth every 4 (four) hours as needed (for pain).     [provider]    aspirin-sod bicarb-citric acid (ALKA-SELTZER) 325 MG TBEF tablet Take 325 mg by mouth daily as needed (for acid reflex).    [provider]  bismuth subsalicylate (PEPTO BISMOL) 262 MG/15ML suspension Take 30 mLs by mouth every 6 (six) hours as needed.    [provider]  HYDROcodone-acetaminophen (NORCO) 5-325 MG tablet Take 1 tablet by mouth every 6 (six) hours as needed for moderate pain. 02/18/17   Kirichenko, Tatyana, PA-C  ibuprofen (ADVIL,MOTRIN) 400 MG tablet Take 1 tablet (400 mg total) by mouth every 8 (eight) hours as needed. Patient not taking: Reported on 02/18/2017 12/20/16   Jola Schmidt, MD  ondansetron (ZOFRAN ODT) 8 MG disintegrating tablet Take 1 tablet (8 mg total) by mouth every 8 (eight) hours as needed for nausea or vomiting. Patient not taking: Reported on 02/18/2017 12/20/16   Jola Schmidt, MD  ondansetron (ZOFRAN ODT) 8 MG disintegrating tablet Take 1 tablet (8 mg total) by mouth every 8 (eight) hours as needed for nausea or vomiting. 02/18/17   Jeannett Senior, PA-C    Family History Family History  Problem Relation Age of Onset  . Hypertension Other   . Asthma Other   . Diabetes Other     Social History Social History  Substance Use Topics  . Smoking status: Current Every Day Smoker    Packs/day: 1.00    Types: Cigarettes  . Smokeless tobacco: Never Used  . Alcohol use No  Allergies   Penicillins   Review of Systems Review of Systems  Constitutional: Negative for fever.  Gastrointestinal: Positive for nausea. Negative for anorexia and vomiting.  Neurological: Positive for headaches.  All other systems reviewed and are negative.    Physical Exam Updated Vital Signs BP 115/67 (BP Location: Right Arm)   Pulse 84   Temp 98.5 F (36.9 C) (Oral)   Resp 20   Ht 5\' 2"  (1.575 m)   Wt 66.2 kg (146 lb)   LMP 07/03/2017   SpO2 99%   BMI 26.70 kg/m   Physical Exam  Constitutional: She is oriented to person, place, and time. She  appears well-developed and well-nourished. No distress.  HENT:  Head: Normocephalic and atraumatic.  Mouth/Throat: Oropharynx is clear and moist.  Eyes: Pupils are equal, round, and reactive to light. EOM are normal.  Neck: Normal range of motion. Neck supple.  Cardiovascular: Normal rate and regular rhythm.  Exam reveals no gallop and no friction rub.   No murmur heard. Pulmonary/Chest: Effort normal and breath sounds normal. No respiratory distress. She has no wheezes.  Abdominal: Soft. Bowel sounds are normal. She exhibits no distension. There is no tenderness.  Musculoskeletal: Normal range of motion.  Neurological: She is alert and oriented to person, place, and time. No cranial nerve deficit. She exhibits normal muscle tone. Coordination normal.  Skin: Skin is warm and dry. She is not diaphoretic.  Nursing note and vitals reviewed.    ED Treatments / Results  Labs (all labs ordered are listed, but only abnormal results are displayed) Labs Reviewed  COMPREHENSIVE METABOLIC PANEL - Abnormal; Notable for the following:       Result Value   Potassium 3.1 (*)    All other components within normal limits  CBC - Abnormal; Notable for the following:    Hemoglobin 7.7 (*)    HCT 26.6 (*)    MCV 68.0 (*)    MCH 19.7 (*)    MCHC 28.9 (*)    RDW 18.0 (*)    All other components within normal limits  URINALYSIS, ROUTINE W REFLEX MICROSCOPIC - Abnormal; Notable for the following:    APPearance HAZY (*)    Hgb urine dipstick SMALL (*)    Leukocytes, UA TRACE (*)    Squamous Epithelial / LPF 0-5 (*)    All other components within normal limits  LIPASE, BLOOD  I-STAT BETA HCG BLOOD, ED ([facility], [facility], [facility] ONLY)    EKG  EKG Interpretation None       Radiology No results found.  Procedures Procedures (including critical care time)  Medications Ordered in ED Medications  sodium chloride 0.9 % bolus 1,000 mL (not administered)  ketorolac (TORADOL) 30 MG/ML injection 30 mg (not  administered)  dexamethasone (DECADRON) injection 10 mg (not administered)  diphenhydrAMINE (BENADRYL) injection 25 mg (not administered)  metoCLOPramide (REGLAN) injection 10 mg (not administered)     Initial Impression / Assessment and Plan / ED Course  I have reviewed the triage vital signs and the nursing notes.  Pertinent labs & imaging results that were available during my care of the patient were reviewed by me and considered in my medical decision making (see chart for details).  Patient presents with what appears to be a migraine. She is feeling better after a migraine cocktail. Laboratory studies are reassuring with the exception of her hemoglobin of 7.7. Denies any melena or bloody stools. I suspect this is chronic based on the red cell indices. She  will be discharged, to return as needed for any problems.  Final Clinical Impressions(s) / ED Diagnoses   Final diagnoses:  None    New Prescriptions New Prescriptions   No medications on file     Veryl Speak, MD 07/12/17 [phone]

## 2017-11-19 ENCOUNTER — Emergency Department (HOSPITAL_COMMUNITY)
Admission: EM | Admit: 2017-11-19 | Discharge: 2017-11-19 | Disposition: A | Discharge status: Home / Self Care | Payer: Self-pay | Attending: Emergency Medicine | Admitting: Emergency Medicine

## 2017-11-19 ENCOUNTER — Encounter (HOSPITAL_COMMUNITY): Payer: Self-pay | Admitting: Emergency Medicine

## 2017-11-19 DIAGNOSIS — K029 Dental caries, unspecified: Secondary | ICD-10-CM | POA: Insufficient documentation

## 2017-11-19 DIAGNOSIS — I10 Essential (primary) hypertension: Secondary | ICD-10-CM | POA: Insufficient documentation

## 2017-11-19 DIAGNOSIS — K047 Periapical abscess without sinus: Secondary | ICD-10-CM | POA: Insufficient documentation

## 2017-11-19 DIAGNOSIS — F17210 Nicotine dependence, cigarettes, uncomplicated: Secondary | ICD-10-CM | POA: Insufficient documentation

## 2017-11-19 MED ORDER — CLINDAMYCIN HCL 150 MG PO CAPS: 450.0000 mg | ORAL_CAPSULE | Freq: Three times a day (TID) | ORAL | 0 refills | Status: AC

## 2017-11-19 MED ORDER — CLINDAMYCIN HCL 300 MG PO CAPS
450.0000 mg | ORAL_CAPSULE | Freq: Once | ORAL | Status: AC
  Administered 2017-11-19: 450 mg via ORAL
  Filled 2017-11-19: qty 1

## 2017-11-19 MED ORDER — DEXAMETHASONE SODIUM PHOSPHATE 10 MG/ML IJ SOLN
10.0000 mg | Freq: Once | INTRAMUSCULAR | Status: AC
  Administered 2017-11-19: 10 mg via INTRAMUSCULAR
  Filled 2017-11-19: qty 1

## 2017-11-19 MED ORDER — DEXAMETHASONE SODIUM PHOSPHATE 10 MG/ML IJ SOLN: 10.0000 mg | Freq: Once | INTRAMUSCULAR | Status: DC

## 2017-11-19 MED ORDER — LIDOCAINE VISCOUS 2 % MT SOLN: 15.0000 mL | OROMUCOSAL | 0 refills | Status: AC | PRN

## 2017-11-19 NOTE — ED Notes (Signed)
Patient tolerating PO fluids and crackers.

## 2017-11-19 NOTE — Discharge Instructions (Addendum)
Take 450 mg (3 capsules) of clindamycin every 8 hours for the next 7 days.  You can use of 15 mL of viscous lidocaine for pain in her mouth every 3 hours as needed.  Cool compresses can be applied to the face to help with pain and swelling.  Please avoid applying warm compresses to the face as they can worsen your symptoms.  You can also take 600 mg of ibuprofen with food every 8 hours to help with pain and swelling.  Please call tomorrow morning to schedule a follow-up appointment with a dentist or an oral surgeon as most of them are closed on Friday.   If you develop new or worsening symptoms, if you develop difficulty breathing, feel as if your throat is closing, have worsening swelling to the face, or are barely able to open your mouth, developed drooling, or muffled voice, please return to the emergency department for re-evaluation.

## 2017-11-19 NOTE — ED Provider Notes (Signed)
[facility] DEPT Provider Note   CSN: [id number] Arrival date & time: 11/19/17  1554     History   Chief Complaint Chief Complaint  Patient presents with  . Dental Pain    HPI Molly [name] is a 37 y.o. female who presents to the emergency department with a chief complaint of right lower sided dental pain that began 5 days ago.  She reports the pain has been constant and worsening since onset.  She reports that the throbbing pain radiates from her right lower teeth, up to her right ear and down the right side of her neck. she denies having recently broken any teeth.  No new trauma or injury to the area.  She denies fever, chills, muffled voice, drooling, or dysphagia.  No treatment prior to arrival. She reports a history of similar symptoms about 6 years ago and had to have a tooth removed due to an abscess, and she is concerned that the pain is in the same area.  She is not currently established with a dentist.  She is allergic to penicillin and amoxicillin.  The history is provided by the patient. No language interpreter was used.    Past Medical History:  Diagnosis Date  . Hypertension   . UTI (lower urinary tract infection)     Patient Active Problem List   Diagnosis Date Noted  . ANEMIA 11/20/2010  . GERD 11/20/2010  . CHOLELITHIASIS 11/20/2010  . SLEEP APNEA 11/20/2010  . ABDOMINAL PAIN, LEFT LOWER QUADRANT 11/20/2010    Past Surgical History:  Procedure Laterality Date  . CHOLECYSTECTOMY    . TUBAL LIGATION      OB History    No data available       Home Medications    Prior to Admission medications   Medication Sig Start Date End Date Taking? Authorizing Provider  Aspirin-Salicylamide-Caffeine (BC HEADACHE POWDER PO) Take 1 packet by mouth every 4 (four) hours as needed (for pain).     [provider]  clindamycin (CLEOCIN) 150 MG capsule Take 3 capsules (450 mg total) by mouth 3 (three) times daily for 7 days.  11/19/17 11/26/17  McDonald, Mia A, PA-C  HYDROcodone-acetaminophen (NORCO) 5-325 MG tablet Take 1 tablet by mouth every 6 (six) hours as needed for moderate pain. Patient not taking: Reported on 07/12/2017 02/18/17   Jeannett Senior, PA-C  ibuprofen (ADVIL,MOTRIN) 400 MG tablet Take 1 tablet (400 mg total) by mouth every 8 (eight) hours as needed. 12/20/16   Jola Schmidt, MD  lidocaine (XYLOCAINE) 2 % solution Use as directed 15 mLs in the mouth or throat every 3 (three) hours as needed for mouth pain. 11/19/17   McDonald, Mia A, PA-C  ondansetron (ZOFRAN ODT) 8 MG disintegrating tablet Take 1 tablet (8 mg total) by mouth every 8 (eight) hours as needed for nausea or vomiting. Patient not taking: Reported on 02/18/2017 12/20/16   Jola Schmidt, MD  ondansetron (ZOFRAN ODT) 8 MG disintegrating tablet Take 1 tablet (8 mg total) by mouth every 8 (eight) hours as needed for nausea or vomiting. Patient not taking: Reported on 07/12/2017 02/18/17   Jeannett Senior, PA-C    Family History Family History  Problem Relation Age of Onset  . Hypertension Other   . Asthma Other   . Diabetes Other     Social History Social History   Tobacco Use  . Smoking status: Current Every Day Smoker    Packs/day: 1.00    Types: Cigarettes  . Smokeless  tobacco: Never Used  Substance Use Topics  . Alcohol use: No  . Drug use: No     Allergies   Penicillins   Review of Systems Review of Systems  Constitutional: Negative for activity change, chills and fever.  HENT: Positive for dental problem and facial swelling. Negative for congestion, trouble swallowing and voice change.   Eyes: Negative for visual disturbance.  Respiratory: Negative for apnea, choking and shortness of breath.   Cardiovascular: Negative for chest pain.  Gastrointestinal: Negative for abdominal pain.  Musculoskeletal: Negative for back pain.  Skin: Negative for rash.  Allergic/Immunologic: Negative for immunocompromised state.    Physical Exam Updated Vital Signs BP (!) 156/104 (BP Location: Left Arm)   Pulse 93   Temp 98.5 F (36.9 C) (Oral)   Resp 16   Ht 5\' 3"  (1.6 m)   Wt 65.8 kg (145 lb)   LMP 11/15/2017   SpO2 100%   BMI 25.69 kg/m   Physical Exam  Constitutional: No distress.  3 finger trismus.   HENT:  Head: Normocephalic.  Right Ear: External ear normal.  Left Ear: External ear normal.  Mouth/Throat: Uvula is midline and oropharynx is clear and moist. Oral lesions present. There is trismus in the jaw. Abnormal dentition. Dental abscesses and dental caries present. No uvula swelling. No tonsillar abscesses. No tonsillar exudate.    Mild swelling the right cheek.  3 finger trismus.  Her airway is patent.  No tripoding.  Speaking in complete fluent sentences.  She has a fluctuant area of <0.5 cm noted to the bucca mucosa on the right.  No surrounding induration. No active drainage.  No erythema or edema noted to the gingiva. Uvula is midline.  No fluctuance or induration noted to the submandibular area.  Tender to palpation over the alveolar space of tooth #30 without erythema, edema, or drainage noted.  Eyes: Conjunctivae are normal.  Neck: Neck supple.  No fluctuance or induration noted to the anterior lateral neck bilaterally.  No palpable lymphadenopathy.  Cardiovascular: Normal rate, regular rhythm, normal heart sounds and intact distal pulses. Exam reveals no gallop and no friction rub.  No murmur heard. Pulmonary/Chest: Effort normal. No stridor. No respiratory distress. She has no wheezes. She has no rales.  Abdominal: Soft. She exhibits no distension.  Neurological: She is alert.  Skin: Skin is warm. No rash noted.  Psychiatric: Her behavior is normal.  Nursing note and vitals reviewed.    ED Treatments / Results  Labs (all labs ordered are listed, but only abnormal results are displayed) Labs Reviewed - No data to display  EKG  EKG Interpretation None       Radiology No  results found.  Procedures Procedures (including critical care time)  Medications Ordered in ED Medications  dexamethasone (DECADRON) injection 10 mg (not administered)  clindamycin (CLEOCIN) capsule 450 mg (450 mg Oral Given 11/19/17 1936)   Initial Impression / Assessment and Plan / ED Course  I have reviewed the triage vital signs and the nursing notes.  Pertinent labs & imaging results that were available during my care of the patient were reviewed by me and considered in my medical decision making (see chart for details).     37 year old female presenting with right mandibular dental pain, worsening over the last 5 days.  She is hemodynamically stable and in no acute distress.  On physical exam, she has mild edema noted to the right cheek and there is an area of less than 0.5 cm of  fluctuance noted to the right buccal mucosa.  No active drainage from this area. She is tender to palpation in the alveolar space of tooth #30. No overlying erythema, edema, or drainage from the area. Discussed with the patient that she would likely benefit from an I&D of the area, but she refused/declined at this time. She stated that she would like to try taking antibiotics before doing a procedure. Discussed the risks and benefits of delaying an I&D, which she acknowledged. Answered all questions. She states that she would like to follow up with a dentist or oral surgeon tomorrow. She was given strict return precautions and her first dose of Clindamycin and a dose of decadron was given in the ED. She was able to eat and drink the ED without difficulty. NAD.  The patient is safe for discharge to home at this time.  Final Clinical Impressions(s) / ED Diagnoses   Final diagnoses:  Dental abscess    ED Discharge Orders        Ordered    clindamycin (CLEOCIN) 150 MG capsule  3 times daily     11/19/17 1954    lidocaine (XYLOCAINE) 2 % solution  Every  3 hours PRN     11/19/17 [place], Lewis A,  PA-C 90/30/09 2330    Delora Fuel, MD 07/62/26 [phone]

## 2017-11-19 NOTE — ED Triage Notes (Signed)
Pt comes in with complaints of lower right sided dental pain that began this past weekend. Pt denies any broken teeth. Had tooth removed due to abscess around 6 years ago and feels the pain is the in the same area.

## 2017-11-28 ENCOUNTER — Emergency Department (HOSPITAL_COMMUNITY)
Admission: EM | Admit: 2017-11-28 | Discharge: 2017-11-28 | Disposition: A | Discharge status: Home / Self Care | Payer: Self-pay | Attending: Emergency Medicine | Admitting: Emergency Medicine

## 2017-11-28 ENCOUNTER — Encounter (HOSPITAL_COMMUNITY): Payer: Self-pay | Admitting: Emergency Medicine

## 2017-11-28 DIAGNOSIS — F17210 Nicotine dependence, cigarettes, uncomplicated: Secondary | ICD-10-CM | POA: Insufficient documentation

## 2017-11-28 DIAGNOSIS — H9201 Otalgia, right ear: Secondary | ICD-10-CM | POA: Insufficient documentation

## 2017-11-28 DIAGNOSIS — J020 Streptococcal pharyngitis: Secondary | ICD-10-CM | POA: Insufficient documentation

## 2017-11-28 DIAGNOSIS — I10 Essential (primary) hypertension: Secondary | ICD-10-CM | POA: Insufficient documentation

## 2017-11-28 LAB — RAPID STREP SCREEN (MED CTR MEBANE ONLY): Streptococcus, Group A Screen (Direct): POSITIVE — AB

## 2017-11-28 MED ORDER — DEXAMETHASONE 4 MG PO TABS
10.0000 mg | ORAL_TABLET | Freq: Once | ORAL | Status: AC
  Administered 2017-11-28: 10 mg via ORAL
  Filled 2017-11-28: qty 2

## 2017-11-28 MED ORDER — CLINDAMYCIN HCL 300 MG PO CAPS: 300.0000 mg | ORAL_CAPSULE | Freq: Three times a day (TID) | ORAL | 0 refills | Status: AC

## 2017-11-28 MED ORDER — CLINDAMYCIN HCL 300 MG PO CAPS
300.0000 mg | ORAL_CAPSULE | Freq: Once | ORAL | Status: AC
  Administered 2017-11-28: 300 mg via ORAL
  Filled 2017-11-28: qty 1

## 2017-11-28 NOTE — ED Provider Notes (Signed)
[facility] DEPT Provider Note   CSN: [id number] Arrival date & time: 11/28/17  1740     History   Chief Complaint Chief Complaint  Patient presents with  . Sore Throat    HPI Molly [name] is a 37 y.o. female with hx of HTN who presents to the ED for a sore throat. Patient reports that today at work she was having pain in her throat and her right ear. After she ate lunch the pain increased and she felt like she was having difficulty swallowing.   HPI  Past Medical History:  Diagnosis Date  . Hypertension   . UTI (lower urinary tract infection)     Patient Active Problem List   Diagnosis Date Noted  . ANEMIA 11/20/2010  . GERD 11/20/2010  . CHOLELITHIASIS 11/20/2010  . SLEEP APNEA 11/20/2010  . ABDOMINAL PAIN, LEFT LOWER QUADRANT 11/20/2010    Past Surgical History:  Procedure Laterality Date  . CHOLECYSTECTOMY    . TUBAL LIGATION      OB History    No data available       Home Medications    Prior to Admission medications   Medication Sig Start Date End Date Taking? Authorizing Provider  Aspirin-Salicylamide-Caffeine (BC HEADACHE POWDER PO) Take 1 packet by mouth every 4 (four) hours as needed (for pain).     [provider]  clindamycin (CLEOCIN) 300 MG capsule Take 1 capsule (300 mg total) by mouth 3 (three) times daily. 11/28/17   Ashley Murrain, NP  HYDROcodone-acetaminophen (NORCO) 5-325 MG tablet Take 1 tablet by mouth every 6 (six) hours as needed for moderate pain. Patient not taking: Reported on 07/12/2017 02/18/17   Jeannett Senior, PA-C  ibuprofen (ADVIL,MOTRIN) 400 MG tablet Take 1 tablet (400 mg total) by mouth every 8 (eight) hours as needed. 12/20/16   Jola Schmidt, MD  lidocaine (XYLOCAINE) 2 % solution Use as directed 15 mLs in the mouth or throat every 3 (three) hours as needed for mouth pain. 11/19/17   McDonald, Mia A, PA-C  ondansetron (ZOFRAN ODT) 8 MG disintegrating tablet Take 1 tablet (8 mg total) by  mouth every 8 (eight) hours as needed for nausea or vomiting. Patient not taking: Reported on 02/18/2017 12/20/16   Jola Schmidt, MD  ondansetron (ZOFRAN ODT) 8 MG disintegrating tablet Take 1 tablet (8 mg total) by mouth every 8 (eight) hours as needed for nausea or vomiting. Patient not taking: Reported on 07/12/2017 02/18/17   Jeannett Senior, PA-C    Family History Family History  Problem Relation Age of Onset  . Hypertension Other   . Asthma Other   . Diabetes Other     Social History Social History   Tobacco Use  . Smoking status: Current Every Day Smoker    Packs/day: 1.00    Types: Cigarettes  . Smokeless tobacco: Never Used  Substance Use Topics  . Alcohol use: No  . Drug use: No     Allergies   Penicillins   Review of Systems Review of Systems  HENT: Positive for sore throat.   Musculoskeletal: Positive for myalgias.  All other systems reviewed and are negative.    Physical Exam Updated Vital Signs BP (!) 152/115 (BP Location: Right Arm)   Pulse 99   Temp 99.9 F (37.7 C) (Oral)   Resp 14   LMP 11/15/2017   SpO2 100%   Physical Exam  Constitutional: She appears well-developed and well-nourished. No distress.  HENT:  Head: Normocephalic.  Right Ear: Tympanic membrane normal.  Left Ear: Tympanic membrane normal.  Nose: Nose normal.  Mouth/Throat: Uvula is midline and mucous membranes are normal. Posterior oropharyngeal erythema present. No oropharyngeal exudate, posterior oropharyngeal edema or tonsillar abscesses.  Posterior pharynx is beefy red  Eyes: Conjunctivae and EOM are normal. Pupils are equal, round, and reactive to light.  Neck: Normal range of motion. Neck supple.  Cardiovascular: Normal rate.  Pulmonary/Chest: Effort normal and breath sounds normal.  Abdominal: Soft. There is no tenderness.  Musculoskeletal: Normal range of motion.  Lymphadenopathy:    She has cervical adenopathy.  Neurological: She is alert.  Skin: Skin is warm  and dry.  Psychiatric: She has a normal mood and affect.  Nursing note and vitals reviewed.    ED Treatments / Results  Labs (all labs ordered are listed, but only abnormal results are displayed) Labs Reviewed  RAPID STREP SCREEN (NOT AT [facility]) - Abnormal; Notable for the following components:      Result Value   Streptococcus, Group A Screen (Direct) POSITIVE (*)    All other components within normal limits   Radiology No results found.  Procedures Procedures (including critical care time)  Medications Ordered in ED Medications  clindamycin (CLEOCIN) capsule 300 mg (300 mg Oral Given 11/28/17 2035)  dexamethasone (DECADRON) tablet 10 mg (10 mg Oral Given 11/28/17 2034)     Initial Impression / Assessment and Plan / ED Course  I have reviewed the triage vital signs and the nursing notes. Pt with low grade fever, no tonsillar exudate. She does have cervical lymphadenopathy, & dysphagia; diagnosis of bacterial pharyngitis. Treated in the ED with steroids and Clindamycin started since patient is allergic to Penicillin.  Pt appears mildly dehydrated, discussed importance of water rehydration. Presentation non concerning for PTA or RPA. No trismus or uvula deviation. Specific return precautions discussed. Pt able to drink water in ED without difficulty with intact air way. Recommended PCP follow up.   Final Clinical Impressions(s) / ED Diagnoses   Final diagnoses:  Strep pharyngitis    ED Discharge Orders        Ordered    clindamycin (CLEOCIN) 300 MG capsule  3 times daily     11/28/17 1940       Debroah Baller [place], [place] 11/28/17 2119    Drenda Freeze, MD 11/28/17 2234

## 2017-11-28 NOTE — Discharge Instructions (Signed)
Follow up with your doctor, return here as needed.

## 2017-11-28 NOTE — ED Triage Notes (Signed)
Patient reports that today while at work started having pain in throat and right ear. Reports that she ate lunch fine then tried to drink later on and couldn't. Reports feels like throat is closing up. Patient able to speak in full sentences and maintain salvia.

## 2017-11-30 ENCOUNTER — Encounter (HOSPITAL_COMMUNITY): Payer: Self-pay | Admitting: *Deleted

## 2017-11-30 ENCOUNTER — Other Ambulatory Visit: Payer: Self-pay

## 2017-11-30 ENCOUNTER — Emergency Department (HOSPITAL_COMMUNITY)
Admission: EM | Admit: 2017-11-30 | Discharge: 2017-11-30 | Disposition: A | Discharge status: Home / Self Care | Payer: Self-pay | Attending: Emergency Medicine | Admitting: Emergency Medicine

## 2017-11-30 DIAGNOSIS — J020 Streptococcal pharyngitis: Secondary | ICD-10-CM | POA: Insufficient documentation

## 2017-11-30 LAB — CBC WITH DIFFERENTIAL/PLATELET
Basophils Absolute: 0 10*3/uL (ref 0.0–0.1)
Basophils Relative: 0 %
EOS PCT: 0 %
Eosinophils Absolute: 0 10*3/uL (ref 0.0–0.7)
HEMATOCRIT: 28.8 % — AB (ref 36.0–46.0)
Hemoglobin: 8.6 g/dL — ABNORMAL LOW (ref 12.0–15.0)
Lymphocytes Relative: 11 %
Lymphs Abs: 2.5 10*3/uL (ref 0.7–4.0)
MCH: 20.3 pg — ABNORMAL LOW (ref 26.0–34.0)
MCHC: 29.9 g/dL — ABNORMAL LOW (ref 30.0–36.0)
MCV: 67.9 fL — AB (ref 78.0–100.0)
MONOS PCT: 7 %
Monocytes Absolute: 1.6 10*3/uL — ABNORMAL HIGH (ref 0.1–1.0)
Neutro Abs: 18.2 10*3/uL — ABNORMAL HIGH (ref 1.7–7.7)
Neutrophils Relative %: 82 %
Platelets: 369 10*3/uL (ref 150–400)
RBC: 4.24 MIL/uL (ref 3.87–5.11)
RDW: 18.3 % — ABNORMAL HIGH (ref 11.5–15.5)
WBC: 22.3 10*3/uL — AB (ref 4.0–10.5)

## 2017-11-30 LAB — COMPREHENSIVE METABOLIC PANEL
ALK PHOS: 63 U/L (ref 38–126)
ALT: 57 U/L — AB (ref 14–54)
AST: 51 U/L — AB (ref 15–41)
Albumin: 3.5 g/dL (ref 3.5–5.0)
Anion gap: 6 (ref 5–15)
BUN: 7 mg/dL (ref 6–20)
CHLORIDE: 107 mmol/L (ref 101–111)
CO2: 26 mmol/L (ref 22–32)
CREATININE: 0.5 mg/dL (ref 0.44–1.00)
Calcium: 8.6 mg/dL — ABNORMAL LOW (ref 8.9–10.3)
GFR calc Af Amer: >60 mL/min (ref >60)
Glucose, Bld: 85 mg/dL (ref 65–99)
Potassium: 2.9 mmol/L — ABNORMAL LOW (ref 3.5–5.1)
Sodium: 139 mmol/L (ref 135–145)
Total Bilirubin: 0.4 mg/dL (ref 0.3–1.2)
Total Protein: 7.2 g/dL (ref 6.5–8.1)

## 2017-11-30 MED ORDER — HYDROCODONE-ACETAMINOPHEN 7.5-325 MG/15ML PO SOLN: 10.0000 mL | Freq: Four times a day (QID) | ORAL | 0 refills | Status: AC | PRN

## 2017-11-30 MED ORDER — SODIUM CHLORIDE 0.9 % IV BOLUS (SEPSIS)
1000.0000 mL | Freq: Once | INTRAVENOUS | Status: AC
  Administered 2017-11-30: 1000 mL via INTRAVENOUS

## 2017-11-30 MED ORDER — DEXTROSE 5 % IV SOLN
1.0000 g | Freq: Once | INTRAVENOUS | Status: AC
  Administered 2017-11-30: 1 g via INTRAVENOUS
  Filled 2017-11-30: qty 10

## 2017-11-30 MED ORDER — POTASSIUM CHLORIDE 20 MEQ/15ML (10%) PO SOLN
40.0000 meq | Freq: Once | ORAL | Status: AC
  Administered 2017-11-30: 40 meq via ORAL
  Filled 2017-11-30: qty 30

## 2017-11-30 MED ORDER — CEPHALEXIN 250 MG/5ML PO SUSR: 500.0000 mg | Freq: Four times a day (QID) | ORAL | 0 refills | Status: AC

## 2017-11-30 MED ORDER — METHYLPREDNISOLONE SODIUM SUCC 125 MG IJ SOLR
125.0000 mg | Freq: Once | INTRAMUSCULAR | Status: AC
  Administered 2017-11-30: 125 mg via INTRAVENOUS
  Filled 2017-11-30: qty 2

## 2017-11-30 NOTE — Discharge Instructions (Signed)
Drink plenty of fluids and follow-up with Dr. Wilburn Cornelia in 1-2 days.  Call their office for an appointment

## 2017-11-30 NOTE — ED Triage Notes (Signed)
Pt seen and dx with Strep on Friday, states she has increased swelling and spitting up blood.

## 2017-12-05 NOTE — ED Provider Notes (Signed)
[facility] DEPT Provider Note   CSN: [id number] Arrival date & time: 11/30/17  1019     History   Chief Complaint Chief Complaint  Patient presents with  . Sore Throat    HPI Molly [name] is a 37 y.o. female.  Patient with a strep pharyngitis.  Patient complains of difficulty swallowing   The history is provided by the patient. No language interpreter was used.  Sore Throat  This is a recurrent problem. The current episode started 2 days ago. The problem occurs constantly. The problem has not changed since onset.Pertinent negatives include no chest pain, no abdominal pain and no headaches. Nothing aggravates the symptoms. Nothing relieves the symptoms.    Past Medical History:  Diagnosis Date  . Hypertension   . UTI (lower urinary tract infection)     Patient Active Problem List   Diagnosis Date Noted  . ANEMIA 11/20/2010  . GERD 11/20/2010  . CHOLELITHIASIS 11/20/2010  . SLEEP APNEA 11/20/2010  . ABDOMINAL PAIN, LEFT LOWER QUADRANT 11/20/2010    Past Surgical History:  Procedure Laterality Date  . CHOLECYSTECTOMY    . TUBAL LIGATION      OB History    No data available       Home Medications    Prior to Admission medications   Medication Sig Start Date End Date Taking? Authorizing Provider  Aspirin-Salicylamide-Caffeine (BC HEADACHE POWDER PO) Take 1 packet by mouth every 4 (four) hours as needed (for pain).    Yes [provider]  clindamycin (CLEOCIN) 300 MG capsule Take 1 capsule (300 mg total) by mouth 3 (three) times daily. 11/28/17  Yes Neese, Arnot, NP  ibuprofen (ADVIL,MOTRIN) 400 MG tablet Take 1 tablet (400 mg total) by mouth every 8 (eight) hours as needed. 12/20/16  Yes Jola Schmidt, MD  lidocaine (XYLOCAINE) 2 % solution Use as directed 15 mLs in the mouth or throat every 3 (three) hours as needed for mouth pain. 11/19/17  Yes McDonald, Mia A, PA-C  cephALEXin (KEFLEX) 250 MG/5ML suspension Take 10 mLs  (500 mg total) by mouth 4 (four) times daily. 11/30/17   Milton Ferguson, MD  HYDROcodone-acetaminophen (HYCET) 7.5-325 mg/15 ml solution Take 10 mLs by mouth every 6 (six) hours as needed for moderate pain. 11/30/17 11/30/18  Milton Ferguson, MD  ondansetron (ZOFRAN ODT) 8 MG disintegrating tablet Take 1 tablet (8 mg total) by mouth every 8 (eight) hours as needed for nausea or vomiting. Patient not taking: Reported on 02/18/2017 12/20/16   Jola Schmidt, MD  ondansetron (ZOFRAN ODT) 8 MG disintegrating tablet Take 1 tablet (8 mg total) by mouth every 8 (eight) hours as needed for nausea or vomiting. Patient not taking: Reported on 07/12/2017 02/18/17   Jeannett Senior, PA-C    Family History Family History  Problem Relation Age of Onset  . Hypertension Other   . Asthma Other   . Diabetes Other     Social History Social History   Tobacco Use  . Smoking status: Current Every Day Smoker    Packs/day: 1.00    Types: Cigarettes  . Smokeless tobacco: Never Used  Substance Use Topics  . Alcohol use: No  . Drug use: No     Allergies   Penicillins   Review of Systems Review of Systems  Constitutional: Negative for appetite change and fatigue.  HENT: Negative for congestion, ear discharge and sinus pressure.        Sore throat difficulty swallowing  Eyes: Negative for discharge.  Respiratory: Negative for cough.   Cardiovascular: Negative for chest pain.  Gastrointestinal: Negative for abdominal pain and diarrhea.  Genitourinary: Negative for frequency and hematuria.  Musculoskeletal: Negative for back pain.  Skin: Negative for rash.  Neurological: Negative for seizures and headaches.  Psychiatric/Behavioral: Negative for hallucinations.     Physical Exam Updated Vital Signs BP (!) 151/103 (BP Location: Left Arm)   Pulse (!) 107   Temp 98.3 F (36.8 C)   Resp 20   Ht 5\' 3"  (1.6 m)   Wt 65.8 kg (145 lb)   LMP 11/15/2017   SpO2 100%   BMI 25.69 kg/m   Physical Exam    Constitutional: She is oriented to person, place, and time. She appears well-developed.  HENT:  Head: Normocephalic.  Pharynx inflamed with no obvious peritonsillar abscess  Eyes: Conjunctivae and EOM are normal. No scleral icterus.  Neck: Neck supple. No thyromegaly present.  Cardiovascular: Normal rate and regular rhythm. Exam reveals no gallop and no friction rub.  No murmur heard. Pulmonary/Chest: No stridor. She has no wheezes. She has no rales. She exhibits no tenderness.  Abdominal: She exhibits no distension. There is no tenderness. There is no rebound.  Musculoskeletal: Normal range of motion. She exhibits no edema.  Lymphadenopathy:    She has no cervical adenopathy.  Neurological: She is oriented to person, place, and time. She exhibits normal muscle tone. Coordination normal.  Skin: No rash noted. No erythema.  Psychiatric: She has a normal mood and affect. Her behavior is normal.     ED Treatments / Results  Labs (all labs ordered are listed, but only abnormal results are displayed) Labs Reviewed  CBC WITH DIFFERENTIAL/PLATELET - Abnormal; Notable for the following components:      Result Value   WBC 22.3 (*)    Hemoglobin 8.6 (*)    HCT 28.8 (*)    MCV 67.9 (*)    MCH 20.3 (*)    MCHC 29.9 (*)    RDW 18.3 (*)    Neutro Abs 18.2 (*)    Monocytes Absolute 1.6 (*)    All other components within normal limits  COMPREHENSIVE METABOLIC PANEL - Abnormal; Notable for the following components:   Potassium 2.9 (*)    Calcium 8.6 (*)    AST 51 (*)    ALT 57 (*)    All other components within normal limits    EKG  EKG Interpretation None       Radiology No results found.  Procedures Procedures (including critical care time)  Medications Ordered in ED Medications  methylPREDNISolone sodium succinate (SOLU-MEDROL) 125 mg/2 mL injection 125 mg (125 mg Intravenous Given 11/30/17 1448)  sodium chloride 0.9 % bolus 1,000 mL (0 mLs Intravenous Stopped 11/30/17  1601)  cefTRIAXone (ROCEPHIN) 1 g in dextrose 5 % 50 mL IVPB (0 g Intravenous Stopped 11/30/17 1541)  potassium chloride 20 MEQ/15ML (10%) solution 40 mEq (40 mEq Oral Given 11/30/17 1545)     Initial Impression / Assessment and Plan / ED Course  I have reviewed the triage vital signs and the nursing notes.  Pertinent labs & imaging results that were available during my care of the patient were reviewed by me and considered in my medical decision making (see chart for details).     Patient with strep pharyngitis.  Patient was given some Rocephin and fluids and was feeling better.  She will follow-up with ENT and is taking Keflex  Final Clinical Impressions(s) / ED Diagnoses  Final diagnoses:  Pharyngitis due to Streptococcus species    ED Discharge Orders        Ordered    cephALEXin (KEFLEX) 250 MG/5ML suspension  4 times daily     11/30/17 1556    HYDROcodone-acetaminophen (HYCET) 7.5-325 mg/15 ml solution  Every 6 hours PRN     11/30/17 1556       Milton Ferguson, MD 12/05/17 1501

## 2018-09-09 IMAGING — US US RENAL
1 series · 14 of 25 positions shown · non-contrast
Comparison: CT 12/20/2016

CLINICAL DATA: Left lower quadrant pain 2 days with nausea,
vomiting and hematuria.

EXAM:
RENAL / URINARY TRACT ULTRASOUND COMPLETE

[Series 1: us renal · 0.23mm/px · 14 of 27 slices shown]
[im 1/27]
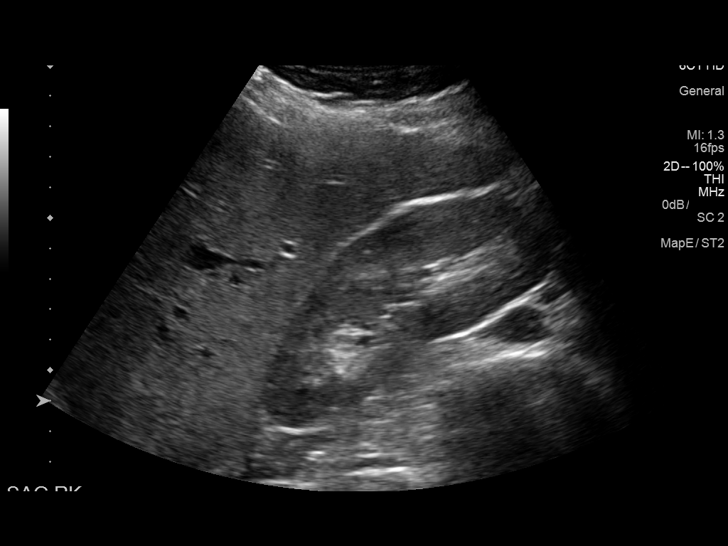
[im 3/27]
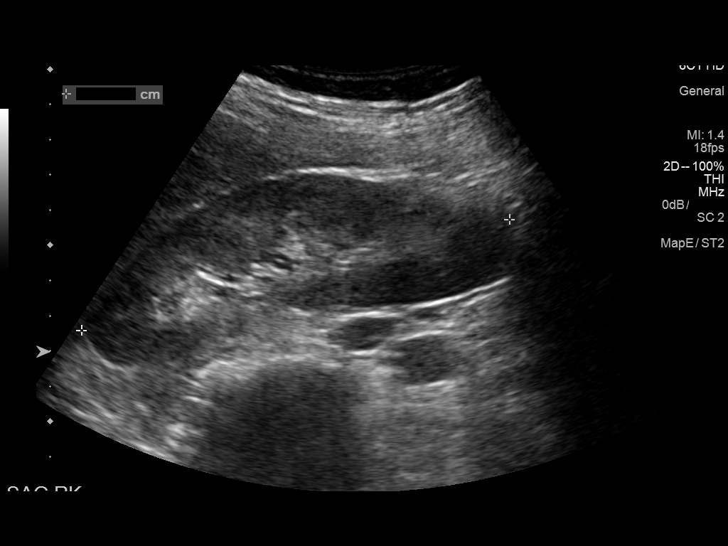
[im 5/27]
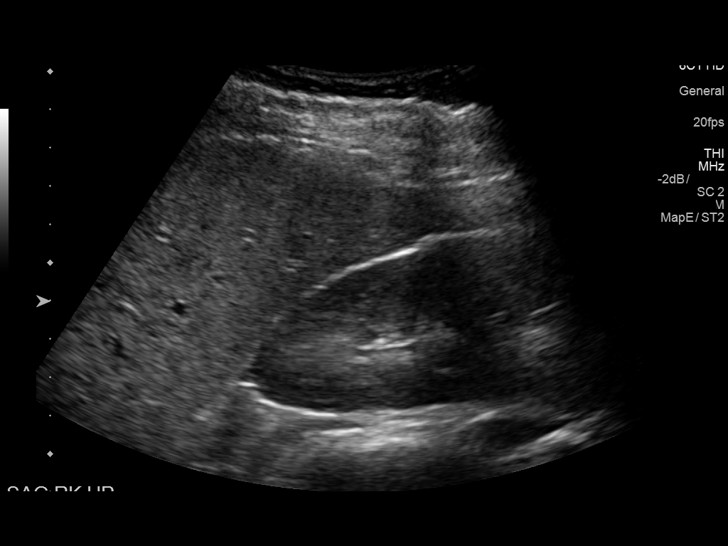
[im 7/27]
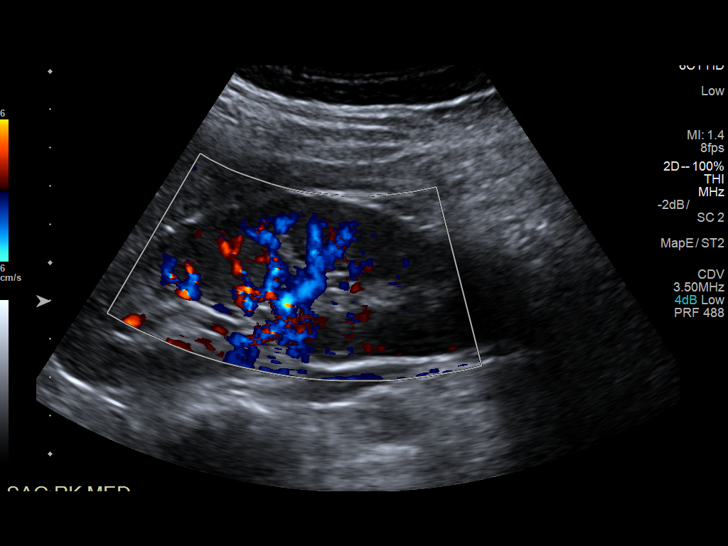
[im 9/27]
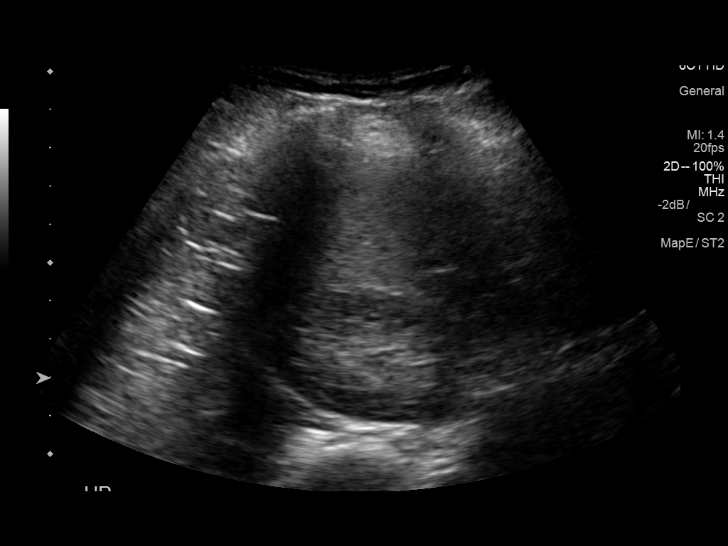
[im 10/27]
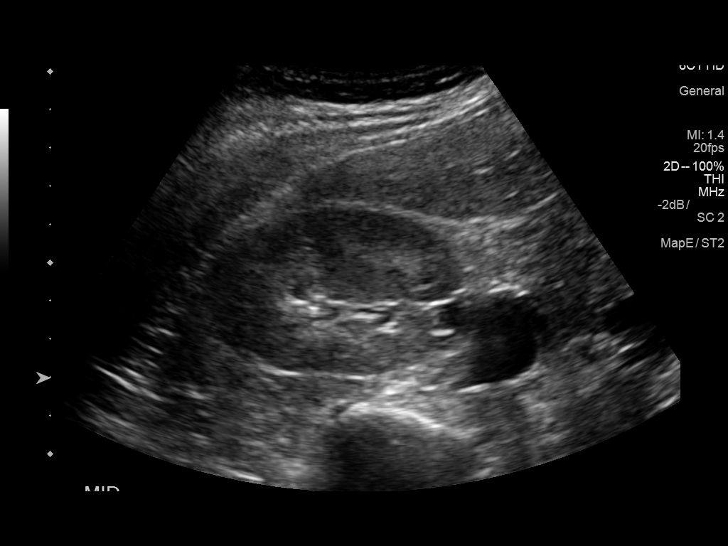
[im 12/27]
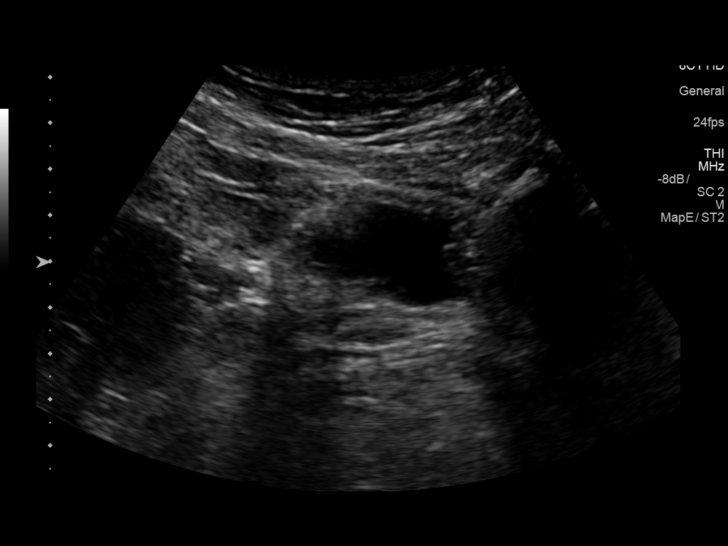
[im 15/27]
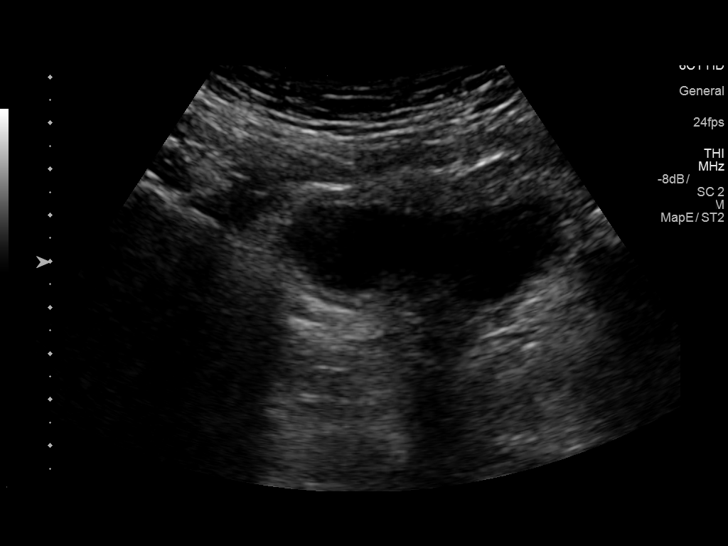
[im 17/27]
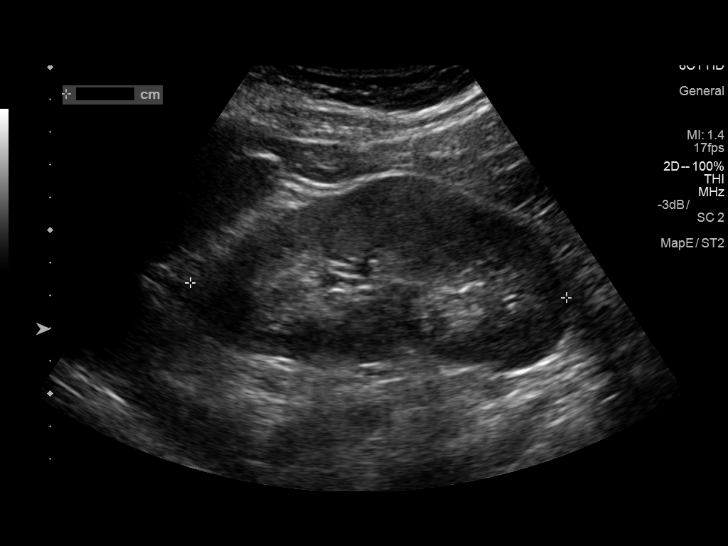
[im 18/27]
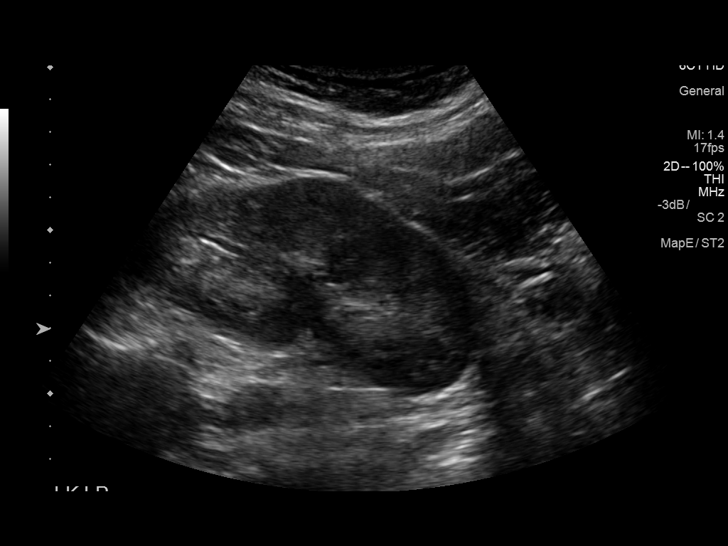
[im 20/27]
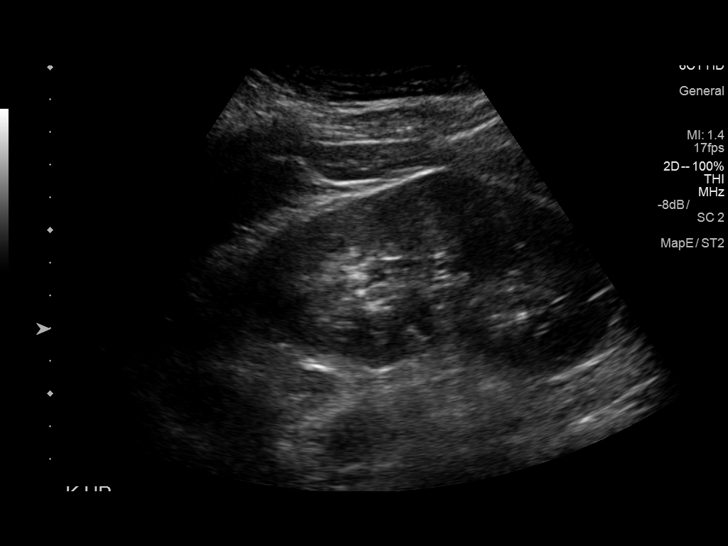
[im 22/27]
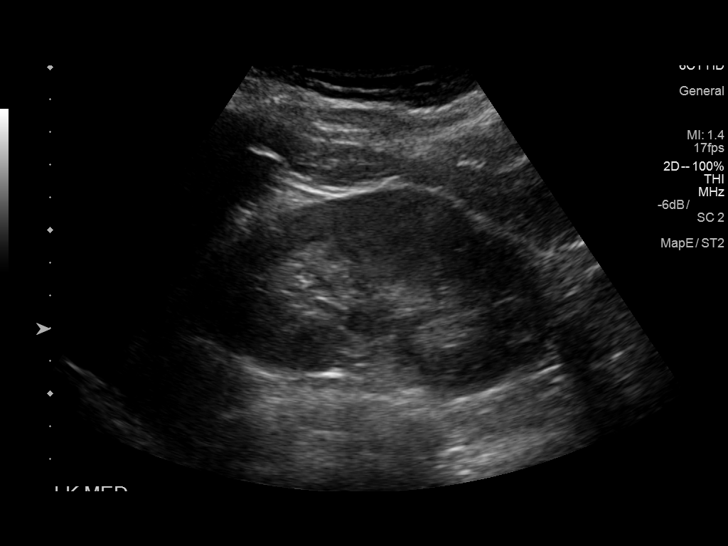
[im 24/27]
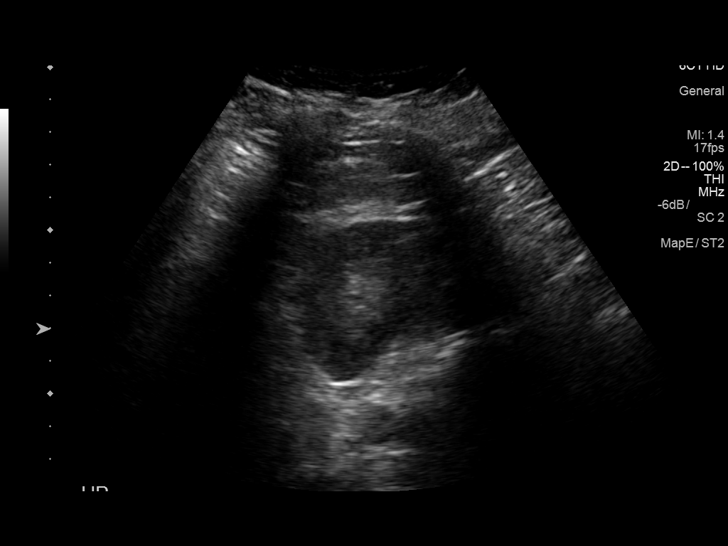
[im 27/27]
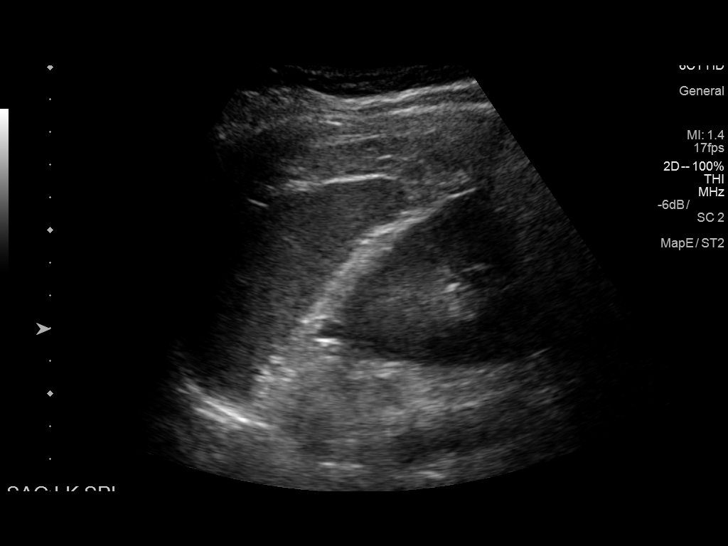

[14 of 25 positions shown; findings below may reference images not displayed]

FINDINGS: Right Kidney:

Length: 12.5 cm. Echogenicity within normal limits. No mass or
hydronephrosis visualized.

Left Kidney:

Length: 12.0 cm. Echogenicity within normal limits. No mass or
hydronephrosis visualized.

Bladder:

Appears normal for degree of bladder distention.
IMPRESSION: Normal renal ultrasound.
